# Patient Record
Sex: Female | Born: 1960 | Race: White | Hispanic: No | Marital: Married | Smoking: Never smoker
Health system: Southern US, Community
[De-identification: ages and names within clinical notes are randomized; demographics above are authoritative.]

## PROBLEM LIST (undated history)

## (undated) DIAGNOSIS — E785 Hyperlipidemia, unspecified: Secondary | ICD-10-CM

## (undated) DIAGNOSIS — M1990 Unspecified osteoarthritis, unspecified site: Secondary | ICD-10-CM

## (undated) DIAGNOSIS — Z1371 Encounter for nonprocreative screening for genetic disease carrier status: Secondary | ICD-10-CM

## (undated) DIAGNOSIS — K449 Diaphragmatic hernia without obstruction or gangrene: Secondary | ICD-10-CM

## (undated) DIAGNOSIS — G43909 Migraine, unspecified, not intractable, without status migrainosus: Secondary | ICD-10-CM

## (undated) DIAGNOSIS — K219 Gastro-esophageal reflux disease without esophagitis: Secondary | ICD-10-CM

## (undated) DIAGNOSIS — M779 Enthesopathy, unspecified: Secondary | ICD-10-CM

## (undated) DIAGNOSIS — Z803 Family history of malignant neoplasm of breast: Secondary | ICD-10-CM

## (undated) DIAGNOSIS — C801 Malignant (primary) neoplasm, unspecified: Secondary | ICD-10-CM

## (undated) DIAGNOSIS — D219 Benign neoplasm of connective and other soft tissue, unspecified: Secondary | ICD-10-CM

## (undated) DIAGNOSIS — Z8041 Family history of malignant neoplasm of ovary: Secondary | ICD-10-CM

## (undated) HISTORY — DX: Migraine, unspecified, not intractable, without status migrainosus: G43.909

## (undated) HISTORY — DX: Benign neoplasm of connective and other soft tissue, unspecified: D21.9

## (undated) HISTORY — PX: BREAST CYST ASPIRATION: SHX578

## (undated) HISTORY — DX: Diaphragmatic hernia without obstruction or gangrene: K44.9

## (undated) HISTORY — PX: BREAST BIOPSY: SHX20

## (undated) HISTORY — DX: Family history of malignant neoplasm of ovary: Z80.41

## (undated) HISTORY — DX: Gastro-esophageal reflux disease without esophagitis: K21.9

## (undated) HISTORY — PX: BREAST EXCISIONAL BIOPSY: SUR124

## (undated) HISTORY — PX: TUBAL LIGATION: SHX77

## (undated) HISTORY — DX: Unspecified osteoarthritis, unspecified site: M19.90

## (undated) HISTORY — DX: Encounter for nonprocreative screening for genetic disease carrier status: Z13.71

## (undated) HISTORY — PX: MELANOMA EXCISION: SHX5266

## (undated) HISTORY — DX: Enthesopathy, unspecified: M77.9

## (undated) HISTORY — DX: Hyperlipidemia, unspecified: E78.5

## (undated) HISTORY — DX: Family history of malignant neoplasm of breast: Z80.3

## (undated) SURGERY — Surgical Case
Anesthesia: *Unknown

---

## 1987-09-02 HISTORY — PX: TUBAL LIGATION: SHX77

## 1995-09-02 HISTORY — PX: BILATERAL CARPAL TUNNEL RELEASE: SHX6508

## 2004-12-03 ENCOUNTER — Ambulatory Visit: Payer: Self-pay

## 2004-12-18 ENCOUNTER — Ambulatory Visit: Payer: Self-pay

## 2006-01-14 ENCOUNTER — Ambulatory Visit: Payer: Self-pay

## 2007-02-04 ENCOUNTER — Ambulatory Visit: Payer: Self-pay

## 2008-03-08 ENCOUNTER — Ambulatory Visit: Payer: Self-pay

## 2008-03-21 ENCOUNTER — Ambulatory Visit: Payer: Self-pay

## 2009-03-22 ENCOUNTER — Ambulatory Visit: Payer: Self-pay

## 2010-09-01 HISTORY — PX: MELANOMA EXCISION: SHX5266

## 2010-09-23 ENCOUNTER — Ambulatory Visit: Payer: Self-pay | Admitting: General Surgery

## 2011-01-10 ENCOUNTER — Ambulatory Visit: Payer: Self-pay | Admitting: Dermatology

## 2011-01-28 ENCOUNTER — Ambulatory Visit: Payer: Self-pay | Admitting: Surgery

## 2011-02-05 ENCOUNTER — Ambulatory Visit: Payer: Self-pay | Admitting: General Surgery

## 2011-03-28 ENCOUNTER — Ambulatory Visit: Payer: Self-pay

## 2012-04-15 ENCOUNTER — Ambulatory Visit: Payer: Self-pay

## 2013-04-19 ENCOUNTER — Ambulatory Visit: Payer: Self-pay

## 2013-09-01 DIAGNOSIS — D219 Benign neoplasm of connective and other soft tissue, unspecified: Secondary | ICD-10-CM

## 2013-09-01 HISTORY — DX: Benign neoplasm of connective and other soft tissue, unspecified: D21.9

## 2014-06-20 ENCOUNTER — Ambulatory Visit: Payer: Self-pay

## 2015-04-20 ENCOUNTER — Other Ambulatory Visit: Payer: Self-pay | Admitting: Unknown Physician Specialty

## 2015-04-20 DIAGNOSIS — Z1239 Encounter for other screening for malignant neoplasm of breast: Secondary | ICD-10-CM

## 2015-06-25 ENCOUNTER — Ambulatory Visit
Admission: RE | Admit: 2015-06-25 | Discharge: 2015-06-25 | Disposition: A | Discharge status: Home / Self Care | Payer: BC Managed Care – PPO | Source: Ambulatory Visit | Attending: Unknown Physician Specialty | Admitting: Unknown Physician Specialty

## 2015-06-25 DIAGNOSIS — Z1231 Encounter for screening mammogram for malignant neoplasm of breast: Secondary | ICD-10-CM | POA: Insufficient documentation

## 2015-06-25 DIAGNOSIS — Z1239 Encounter for other screening for malignant neoplasm of breast: Secondary | ICD-10-CM

## 2015-06-25 HISTORY — DX: Malignant (primary) neoplasm, unspecified: C80.1

## 2016-04-01 HISTORY — PX: COLONOSCOPY WITH ESOPHAGOGASTRODUODENOSCOPY (EGD): SHX5779

## 2016-07-15 ENCOUNTER — Other Ambulatory Visit: Payer: Self-pay | Admitting: Certified Nurse Midwife

## 2016-07-15 DIAGNOSIS — Z1231 Encounter for screening mammogram for malignant neoplasm of breast: Secondary | ICD-10-CM

## 2016-08-28 ENCOUNTER — Ambulatory Visit
Admission: RE | Admit: 2016-08-28 | Discharge: 2016-08-28 | Disposition: A | Discharge status: Home / Self Care | Payer: BC Managed Care – PPO | Source: Ambulatory Visit | Attending: Certified Nurse Midwife | Admitting: Certified Nurse Midwife

## 2016-08-28 DIAGNOSIS — Z1231 Encounter for screening mammogram for malignant neoplasm of breast: Secondary | ICD-10-CM

## 2017-08-13 ENCOUNTER — Ambulatory Visit: Payer: Self-pay | Admitting: Certified Nurse Midwife

## 2017-08-28 ENCOUNTER — Encounter: Payer: Self-pay | Admitting: Certified Nurse Midwife

## 2017-08-28 ENCOUNTER — Ambulatory Visit (INDEPENDENT_AMBULATORY_CARE_PROVIDER_SITE_OTHER): Payer: BC Managed Care – PPO | Admitting: Certified Nurse Midwife

## 2017-08-28 ENCOUNTER — Other Ambulatory Visit: Payer: BC Managed Care – PPO

## 2017-08-28 VITALS — BP 110/70 | HR 74 | Ht 62.0 in | Wt 158.0 lb

## 2017-08-28 DIAGNOSIS — Z803 Family history of malignant neoplasm of breast: Secondary | ICD-10-CM

## 2017-08-28 DIAGNOSIS — K449 Diaphragmatic hernia without obstruction or gangrene: Secondary | ICD-10-CM | POA: Insufficient documentation

## 2017-08-28 DIAGNOSIS — Z124 Encounter for screening for malignant neoplasm of cervix: Secondary | ICD-10-CM

## 2017-08-28 DIAGNOSIS — Z8041 Family history of malignant neoplasm of ovary: Secondary | ICD-10-CM | POA: Diagnosis not present

## 2017-08-28 DIAGNOSIS — Z1231 Encounter for screening mammogram for malignant neoplasm of breast: Secondary | ICD-10-CM | POA: Diagnosis not present

## 2017-08-28 DIAGNOSIS — N841 Polyp of cervix uteri: Secondary | ICD-10-CM | POA: Diagnosis not present

## 2017-08-28 DIAGNOSIS — K219 Gastro-esophageal reflux disease without esophagitis: Secondary | ICD-10-CM | POA: Insufficient documentation

## 2017-08-28 DIAGNOSIS — Z01419 Encounter for gynecological examination (general) (routine) without abnormal findings: Secondary | ICD-10-CM | POA: Diagnosis not present

## 2017-08-28 DIAGNOSIS — Z1239 Encounter for other screening for malignant neoplasm of breast: Secondary | ICD-10-CM

## 2017-08-28 DIAGNOSIS — C439 Malignant melanoma of skin, unspecified: Secondary | ICD-10-CM | POA: Insufficient documentation

## 2017-08-28 NOTE — Progress Notes (Signed)
Gynecology Annual Exam  PCP: Patient, No Pcp Per  Chief Complaint:  Chief Complaint  Patient presents with  . Gynecologic Exam    History of Present Illness:Diamond Murray presents today for her annual exam. She is a 56 year old Caucasian/White female, G1 P1001, whose LMP was 12/01/2014. She is having no significant GYN problems. Has recently had some perianal burning and itching after a constipated stool. Once she had some blood on the toilet tissue after a hard stool. Has been using Cortisone ointment and Prep H with some relief. Wants to know if she has a hemorrhoid. Her menses are absent and she is postmenopausal. Her hot flashes have decreased in frequency and intensity, but occasionally still uses vitamin E.  She has had no spotting.   The patient's past medical history is notable for a history of arthritis, GERD, uterine fibroids, migraines, hiatal hernia, and seasonal allergies.. She has also had an excision of a melanoma from her right leg.  Since her last annual GYN exam dated 07/11/2016, she has had no significant changes in her health. She is sexually active. She does not have vaginal dryness.   Her most recent pap smear was obtained 07/11/2016 and was NIL.  Her most recent mammogram obtained on 08/28/2016 was normal. There is a positive history of breast cancer in her mother and sister. Genetic testing has been done. She tested negative for BRCA 1, negative for BRCA 2, and BART negative. There is a family history of ovarian cancer in her PGM (was reported as uterine cancer in the past). The patient does do monthly self breast exams.  She had a colonoscopy in August 2017 that showed non-cancerous polyps. Her next colonoscopy is due in 5 years She had a recent DEXA scan obtained in unsure date that showed osteopenia.  The patient does not smoke.  The patient does not drink alcohol.  The patient does not use illegal drugs.  The patient exercises regularly at home. The  patient does get adequate calcium in her diet and with her supplement. She had a recent cholesterol screen in 2016 by PCP at [facility]. that was normal.    The patient denies current symptoms of depression.    Review of Systems: Review of Systems  Constitutional: Negative for chills, fever and weight loss.  HENT: Negative for congestion, sinus pain and sore throat.   Eyes: Negative for blurred vision and pain.  Respiratory: Negative for hemoptysis, shortness of breath and wheezing.   Cardiovascular: Negative for chest pain, palpitations and leg swelling.  Gastrointestinal: Positive for blood in stool (once after hard stool). Negative for abdominal pain, diarrhea, heartburn, nausea and vomiting.       Positive for rectal itching and burning  Genitourinary: Negative for dysuria, frequency, hematuria and urgency.  Musculoskeletal: Positive for back pain and joint pain (legs and knees). Negative for myalgias.  Skin: Negative for itching and rash.  Neurological: Negative for dizziness, tingling and headaches.  Endo/Heme/Allergies: Positive for environmental allergies. Negative for polydipsia. Does not bruise/bleed easily.       Negative for hirsutism   Psychiatric/Behavioral: Negative for depression. The patient is not nervous/anxious and does not have insomnia.     Past Medical History:  Past Medical History:  Diagnosis Date  . Arthritis   . Cancer ([place])    Melanoma on leg  . Family history of breast cancer in first degree relative    mother and sister  . Family history of  ovarian cancer   . Fibroids 2015   intramural  . GERD (gastroesophageal reflux disease)   . Hiatal hernia     Past Surgical History:  Past Surgical History:  Procedure Laterality Date  . BILATERAL CARPAL TUNNEL RELEASE  1997  . BREAST BIOPSY Right    asp and 2 bx   . COLONOSCOPY WITH ESOPHAGOGASTRODUODENOSCOPY (EGD)  04/2016   one non cancerous polyp  . MELANOMA EXCISION  2012   right  leg  . TUBAL LIGATION      Family History:  Family History  Problem Relation Age of Onset  . Breast cancer Mother 72  . Diabetes Mother   . Cancer Mother        mouth cancer  . COPD Mother   . Breast cancer Sister 32  . Hypertension Sister   . Ovarian cancer Paternal Grandmother 69  . Hypertension Brother        donated kidney to father  . Colon cancer Maternal Grandmother 44  . AAA (abdominal aortic aneurysm) Maternal Grandmother 80       cause of death  . Lung cancer Maternal Grandfather 46  . Kidney failure Father        kidney transplant    Social History:  Social History   Socioeconomic History  . Marital status: Married    Spouse name: Not on file  . Number of children: 1  . Years of education: Not on file  . Highest education level: Not on file  Social Needs  . Financial resource strain: Not on file  . Food insecurity - worry: Not on file  . Food insecurity - inability: Not on file  . Transportation needs - medical: Not on file  . Transportation needs - non-medical: Not on file  Occupational History  . Occupation: Control and instrumentation engineer  Tobacco Use  . Smoking status: Never Smoker  . Smokeless tobacco: Never Used  Substance and Sexual Activity  . Alcohol use: No    Frequency: Never  . Drug use: No  . Sexual activity: Yes    Birth control/protection: Post-menopausal  Other Topics Concern  . Not on file  Social History Narrative  . Not on file    Allergies:  Allergies  Allergen Reactions  . Nitrofurantoin Nausea Only    & WEAKNESS  . Tomato Hives  . Sulfa Antibiotics Swelling, Rash and Hives   Medications:  Current Outpatient Medications:  .  calcium citrate-vitamin D (CITRACAL+D) 315-200 MG-UNIT tablet, Take by mouth., Disp: , Rfl:  .  Cetirizine HCl (ZYRTEC ALLERGY) 10 MG CAPS, Take by mouth., Disp: , Rfl:  .  Cyanocobalamin (VITAMIN B-12) 2000 MCG TBCR, Take by mouth., Disp: , Rfl:  .  omeprazole (PRILOSEC) 20 MG capsule, Take 20 mg by mouth  daily., Disp: , Rfl:  .  SUMAtriptan (IMITREX) 100 MG tablet, Take by mouth., Disp: , Rfl:  .  vitamin E 400 UNIT capsule, Take by mouth., Disp: , Rfl:    Physical Exam Vitals: BP 110/70   Pulse 74   Ht '5\' 2"'$  (1.575 m)   Wt 158 lb (71.7 kg)   BMI 28.90 kg/m   General: WF in NAD HEENT: normocephalic, anicteric Neck: no thyroid enlargement, no palpable nodules, no cervical lymphadenopathy  Pulmonary: No increased work of breathing, CTAB Cardiovascular: RRR, without murmur  Breast: Breast symmetrical, no tenderness, no palpable nodules or masses, no skin or nipple retraction present, no nipple discharge.  No axillary, infraclavicular or supraclavicular lymphadenopathy. Abdomen: Soft, non-tender, non-distended.  Umbilicus without lesions.  No hepatomegaly or masses palpable. No evidence of hernia. Genitourinary:  External: Normal external female genitalia.  Normal urethral meatus, normal Bartholin's and Skene's glands.    Vagina: decreased rugae, no evidence of prolapse.    Cervix: Endocervical polyp at cervical os on a stalk-removed and sent to pathology, silver nitrate applied for hemostasis; NT  Uterus: Anteverted, small, mobile, and non-tender  Adnexa: No adnexal masses, non-tender  Rectal: small swelling at 12 o'clock ? hemorrhoid  Lymphatic: no evidence of inguinal lymphadenopathy Extremities: no edema, erythema, or tenderness Neurologic: Grossly intact Psychiatric: mood appropriate, affect full     Assessment: 56 y.o. annual gyn exam Endocervical polyp Family history of breast and ovarian cancer  Plan:    1) Breast cancer screening - recommend monthly self breast exam and annual mammograms. Mammogram was ordered today. Recommended patient have the [place] update test. She agreed and blood work was drawn and sent to [facility]. Will have her return in 6 weeks for results. TC has not been done. Will see what lifetime risk is after MYRISK returns and offer risk reduction  strategies based upon their recommendations.  2) Colon cancer screening-colonoscopy UTD, next due in 2022  3) Cervical cancer screening - Pap was done. Endocervical polyp was removed and sent to pathology. Will call with results.  4) Routine healthcare maintenance including cholesterol and diabetes screening managed by PCP   Dalia Heading, CNM

## 2017-08-29 LAB — IGP,RFX APTIMA HPV ALL PTH: PAP Smear Comment: 0

## 2017-09-01 DIAGNOSIS — Z1371 Encounter for nonprocreative screening for genetic disease carrier status: Secondary | ICD-10-CM

## 2017-09-01 HISTORY — DX: Encounter for nonprocreative screening for genetic disease carrier status: Z13.71

## 2017-09-02 LAB — PATHOLOGY

## 2017-09-14 ENCOUNTER — Encounter: Payer: Self-pay | Admitting: Obstetrics and Gynecology

## 2017-09-21 ENCOUNTER — Ambulatory Visit
Admission: RE | Admit: 2017-09-21 | Discharge: 2017-09-21 | Disposition: A | Discharge status: Home / Self Care | Payer: BC Managed Care – PPO | Source: Ambulatory Visit | Attending: Certified Nurse Midwife | Admitting: Certified Nurse Midwife

## 2017-09-21 DIAGNOSIS — Z1231 Encounter for screening mammogram for malignant neoplasm of breast: Secondary | ICD-10-CM | POA: Diagnosis not present

## 2017-09-21 DIAGNOSIS — Z1239 Encounter for other screening for malignant neoplasm of breast: Secondary | ICD-10-CM

## 2017-09-21 DIAGNOSIS — R928 Other abnormal and inconclusive findings on diagnostic imaging of breast: Secondary | ICD-10-CM | POA: Diagnosis not present

## 2017-09-22 ENCOUNTER — Other Ambulatory Visit: Payer: Self-pay | Admitting: Certified Nurse Midwife

## 2017-09-22 DIAGNOSIS — N6310 Unspecified lump in the right breast, unspecified quadrant: Secondary | ICD-10-CM

## 2017-09-22 DIAGNOSIS — R928 Other abnormal and inconclusive findings on diagnostic imaging of breast: Secondary | ICD-10-CM

## 2017-10-01 ENCOUNTER — Ambulatory Visit
Admission: RE | Admit: 2017-10-01 | Discharge: 2017-10-01 | Disposition: A | Discharge status: Home / Self Care | Payer: BC Managed Care – PPO | Source: Ambulatory Visit | Attending: Certified Nurse Midwife | Admitting: Certified Nurse Midwife

## 2017-10-01 DIAGNOSIS — R928 Other abnormal and inconclusive findings on diagnostic imaging of breast: Secondary | ICD-10-CM

## 2017-10-01 DIAGNOSIS — Z803 Family history of malignant neoplasm of breast: Secondary | ICD-10-CM | POA: Insufficient documentation

## 2017-10-01 DIAGNOSIS — N6310 Unspecified lump in the right breast, unspecified quadrant: Secondary | ICD-10-CM

## 2017-10-13 ENCOUNTER — Ambulatory Visit: Payer: BC Managed Care – PPO | Admitting: Certified Nurse Midwife

## 2017-10-15 ENCOUNTER — Ambulatory Visit (INDEPENDENT_AMBULATORY_CARE_PROVIDER_SITE_OTHER): Payer: BC Managed Care – PPO | Admitting: Certified Nurse Midwife

## 2017-10-15 ENCOUNTER — Encounter: Payer: Self-pay | Admitting: Certified Nurse Midwife

## 2017-10-15 VITALS — BP 112/62 | HR 90 | Ht 62.0 in | Wt 159.0 lb

## 2017-10-15 DIAGNOSIS — Z803 Family history of malignant neoplasm of breast: Secondary | ICD-10-CM

## 2017-10-15 DIAGNOSIS — Z1379 Encounter for other screening for genetic and chromosomal anomalies: Secondary | ICD-10-CM | POA: Diagnosis not present

## 2017-10-15 DIAGNOSIS — Z8041 Family history of malignant neoplasm of ovary: Secondary | ICD-10-CM | POA: Diagnosis not present

## 2017-10-15 NOTE — Progress Notes (Signed)
  History of Present Illness:  Diamond Murray is a 57 y.o. who presents for her [place] results that was done On 08/28/2017. She has a family history of breast cancer in her mother and sister and ovarian cancer  In her PGM. Previous HBOC testing was negative.  The Myrisk update testing was also negative. Her lifetime risk of breast cancer is calculated at 16.6%  PMHx: She  has a past medical history of Arthritis, BRCA gene mutation negative (09/2017), Cancer [facility]), Family history of breast cancer in first degree relative, Family history of ovarian cancer, Fibroids (2015), GERD (gastroesophageal reflux disease), and Hiatal hernia. Also,  has a past surgical history that includes Breast biopsy (Right); Bilateral carpal tunnel release (1997); Colonoscopy with esophagogastroduodenoscopy (egd) (04/2016); Melanoma excision (2012); and Tubal ligation., family history includes AAA (abdominal aortic aneurysm) (age of onset: 58) in her maternal grandmother; Breast cancer (age of onset: 33) in her mother; Breast cancer (age of onset: 43) in her sister; COPD in her mother; Cancer in her mother; Colon cancer (age of onset: 44) in her maternal grandmother; Diabetes in her mother; Hypertension in her brother and sister; Kidney failure in her father; Lung cancer (age of onset: 28) in her maternal grandfather; Ovarian cancer (age of onset: 72) in her paternal grandmother.,  reports that  has never smoked. she has never used smokeless tobacco. She reports that she does not drink alcohol or use drugs.  She has a current medication list which includes the following prescription(s): calcium citrate-vitamin d, cetirizine hcl, vitamin b-12, omeprazole, sumatriptan, and vitamin e. Also, is allergic to nitrofurantoin; tomato; and sulfa antibiotics.  ROS  Physical Exam:  BP 112/62   Pulse 90   Ht _0  (1.575 m)   Wt 159 lb (72.1 kg)   BMI 29.08 kg/m  Body mass index is 29.08 kg/m. Constitutional: Well nourished, well  developed female in no acute distress.  Abdomen: diffusely non tender to palpation, non distended, and no masses, hernias Neuro: Grossly intact Psych:  Normal mood and affect.    Assessment: Negative MYRISK testing Lifetime risk of breast cancer of 16.6%  Plan: Recommend annual screening mammograms and anuual professional breat exams Encourage monthly SBE.  She was amenable to this plan and we will see her back for annual/PRN.  Dalia Heading, CNM [facility], [facility] 10/15/2017  4:05 PM

## 2018-09-12 NOTE — Progress Notes (Signed)
Gynecology Annual Exam  PCP: Patient, No Pcp Per  Chief Complaint:  Chief Complaint  Patient presents with  . Gynecologic Exam    No complaints    History of Present Illness:Diamond Murray presents today for her annual exam. She is a 58 year old postmenopausal Caucasian/White female, G1 P1001, whose LMP was 12/01/2014.  Denied vaginal bleeding, but had a discharge "with some color to it" recently. Has also had an area on the left side of her labia that has been intermittently itching/ irritated. Does have a history of eczema. Applied HC cream to the area with some relief. Has also used Neosporin with limited relief.  Her hot flashes have decreased in frequency and intensity, but occasionally still uses vitamin E.  She is sexually active. She does not have vaginal dryness or pain with intercourse at this time.    The patient's past medical history is notable for a history of arthritis, GERD, uterine fibroids, migraines, hiatal hernia, and seasonal allergies.. She has also had an excision of a melanoma from her right leg.  Since her last annual GYN exam dated 08/28/2017, she has had no other significant changes in her health. She is sexually active. She does not have vaginal dryness.   Her most recent pap smear was obtained 08/28/2017 and was NIL.  Her most recent mammogram obtained on 09/21/2017 was Birads 0 for possible right breast mass. Diagnostic mammogram and ultrasound were negative for any mass-and were Birads 1. There is a positive history of breast cancer in her mother and sister and a family history of ovarian cancer in her PGM (was reported as uterine cancer in the past).  Genetic testing has been done. [place] tested negative for MYRISK last year. Her lifetime risk of breast cancer is 16.6%.  The patient does do monthly self breast exams.  She had a colonoscopy in August 2017 that showed non-cancerous polyps. Her next colonoscopy is due in 5 years She had a recent DEXA scan  obtained in unsure date that showed osteopenia.  The patient does not smoke.  The patient does not drink alcohol.  The patient does not use illegal drugs.  The patient has not been exercising since getting a second job working at [employer]. The patient does get adequate calcium in her diet and with her supplement. She had a recent cholesterol screen in 2016 by PCP at [facility]. that was normal.      Review of Systems: Review of Systems  Constitutional: Negative for chills, fever and weight loss.  HENT: Negative for congestion, sinus pain and sore throat.   Eyes: Negative for blurred vision and pain.  Respiratory: Negative for hemoptysis, shortness of breath and wheezing.   Cardiovascular: Negative for chest pain, palpitations and leg swelling.  Gastrointestinal: Negative for abdominal pain, blood in stool (once after hard stool), diarrhea, heartburn, nausea and vomiting.       Positive for rectal itching and burning  Genitourinary: Negative for dysuria, frequency, hematuria and urgency.  Musculoskeletal: Positive for back pain and joint pain (legs and knees). Negative for myalgias.  Skin: Positive for itching and rash.  Neurological: Negative for dizziness, tingling and headaches.  Endo/Heme/Allergies: Positive for environmental allergies. Negative for polydipsia. Does not bruise/bleed easily.       Negative for hirsutism, positive for occasional hot flashes   Psychiatric/Behavioral: Negative for depression. The patient is nervous/anxious. The patient does not have insomnia.   Breast/chest:   Past Medical History:  Past Medical History:  Diagnosis Date  . Arthritis   . BRCA gene mutation negative 09/2017   MyRisk neg  . Cancer ([place])    Melanoma on leg  . Family history of breast cancer in first degree relative    mother and sister; IBIS=16%  . Family history of ovarian cancer   . Fibroids 2015   intramural  . GERD (gastroesophageal reflux disease)    . Hiatal hernia     Past Surgical History:  Past Surgical History:  Procedure Laterality Date  . BILATERAL CARPAL TUNNEL RELEASE  1997  . BREAST BIOPSY Right    asp and 2 bx   . COLONOSCOPY WITH ESOPHAGOGASTRODUODENOSCOPY (EGD)  04/2016   one non cancerous polyp  . MELANOMA EXCISION  2012   right leg  . TUBAL LIGATION      Family History:  Family History  Problem Relation Age of Onset  . Breast cancer Mother 1  . Diabetes Mother   . Cancer Mother        mouth cancer  . COPD Mother   . Breast cancer Sister 19  . Hypertension Sister   . Ovarian cancer Paternal Grandmother 30  . Hypertension Brother        donated kidney to father  . Colon cancer Maternal Grandmother 51  . AAA (abdominal aortic aneurysm) Maternal Grandmother 80       cause of death  . Lung cancer Maternal Grandfather 72  . Kidney failure Father        kidney transplant    Social History:  Social History   Socioeconomic History  . Marital status: Married    Spouse name: Not on file  . Number of children: 1  . Years of education: Not on file  . Highest education level: Not on file  Occupational History  . Occupation: Control and instrumentation engineer  Social Needs  . Financial resource strain: Not on file  . Food insecurity:    Worry: Not on file    Inability: Not on file  . Transportation needs:    Medical: Not on file    Non-medical: Not on file  Tobacco Use  . Smoking status: Never Smoker  . Smokeless tobacco: Never Used  Substance and Sexual Activity  . Alcohol use: No    Frequency: Never  . Drug use: No  . Sexual activity: Yes    Birth control/protection: Post-menopausal  Lifestyle  . Physical activity:    Days per week: 2 days    Minutes per session: 30 min  . Stress: To some extent  Relationships  . Social connections:    Talks on phone: More than three times a week    Gets together: Once a week    Attends religious service: More than 4 times per year    Active member of club or  organization: Yes    Attends meetings of clubs or organizations: More than 4 times per year    Relationship status: Married  . Intimate partner violence:    Fear of current or ex partner: No    Emotionally abused: No    Physically abused: No    Forced sexual activity: No  Other Topics Concern  . Not on file  Social History Narrative  . Not on file    Allergies:  Allergies  Allergen Reactions  . Nitrofurantoin Nausea Only    & WEAKNESS  . Tomato Hives  . Sulfa Antibiotics Swelling, Rash and Hives   Medications:  Current Outpatient Medications:  .  calcium citrate-vitamin D (CITRACAL+D) 315-200 MG-UNIT tablet, Take by mouth., Disp: , Rfl:  .  Cetirizine HCl (ZYRTEC ALLERGY) 10 MG CAPS, Take by mouth., Disp: , Rfl:  .  Cyanocobalamin (VITAMIN B-12) 2000 MCG TBCR, Take by mouth., Disp: , Rfl:  .  SUMAtriptan (IMITREX) 100 MG tablet, Take by mouth., Disp: , Rfl:  .  vitamin E 400 UNIT capsule, Take by mouth., Disp: , Rfl:  ..  omeprazole (PRILOSEC) 20 MG capsule, Take 20 mg by mouth daily., Disp: , Rfl:    Physical Exam Vitals: BP (!) 104/58 (BP Location: Right Arm, Patient Position: Sitting, Cuff Size: Normal)   Pulse 85   Ht _0  (1.575 m)   Wt 161 lb (73 kg)   SpO2 98%   BMI 29.45 kg/m   General: WF in NAD HEENT: normocephalic, anicteric Neck: no thyroid enlargement, no palpable nodules, no cervical lymphadenopathy  Pulmonary: No increased work of breathing, CTAB Cardiovascular: RRR, without murmur  Breast: Breast symmetrical, no tenderness, no palpable nodules or masses, no skin or nipple retraction present, no nipple discharge.  No axillary, infraclavicular or supraclavicular lymphadenopathy. Abdomen: Soft, non-tender, non-distended.  Umbilicus without lesions.  No hepatomegaly or masses palpable. No evidence of hernia. Genitourinary:  External: 2cm x 1 cm patch of inflammation on left buttock near panty line.  Normal urethral meatus, normal Bartholin's and Skene's  glands.    Vagina: decreased rugae, no evidence of prolapse, gray vaginal discharge   Cervix: no lesions, pink  Uterus: Anteverted, small, mobile, and non-tender  Adnexa: No adnexal masses, non-tender  Rectal: small inflamed hemorrhoid at 12 o'clcok  Lymphatic: no evidence of inguinal lymphadenopathy Extremities: no edema, erythema, or tenderness Neurologic: Grossly intact Psychiatric: mood appropriate, affect full  Results for orders placed or performed in visit on 09/13/18 (from the past 24 hour(s))  POCT Wet Prep [facility] [place])     Status: Abnormal   Collection Time: 09/13/18  5:38 PM  Result Value Ref Range   Source Wet Prep POC vagina    WBC, Wet Prep HPF POC many    Bacteria Wet Prep HPF POC Many (A) Few   BACTERIA WET PREP MORPHOLOGY POC     Clue Cells Wet Prep HPF POC None None   Clue Cells Wet Prep Whiff POC     Yeast Wet Prep HPF POC None None   KOH Wet Prep POC     Trichomonas Wet Prep HPF POC Absent Absent     Assessment: 58 y.o. 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 Mammogram was ordered today.  2) Colon cancer screening-colonoscopy UTD, next due in 2022  3) Cervical cancer screening - Pap was done.   4) Routine healthcare maintenance including cholesterol and diabetes screening managed by PCP   5) Clindamycin vaginal cream qhs x 7 days. Kenalog 0.1% to external rash BID prn.   6) RTO 1 year and prn persistent symptoms  Dalia Heading, CNM

## 2018-09-13 ENCOUNTER — Encounter: Payer: Self-pay | Admitting: Certified Nurse Midwife

## 2018-09-13 ENCOUNTER — Other Ambulatory Visit: Payer: Self-pay

## 2018-09-13 ENCOUNTER — Other Ambulatory Visit (HOSPITAL_COMMUNITY)
Admission: RE | Admit: 2018-09-13 | Discharge: 2018-09-13 | Disposition: A | Discharge status: Home / Self Care | Payer: BC Managed Care – PPO | Source: Ambulatory Visit | Attending: Certified Nurse Midwife | Admitting: Certified Nurse Midwife

## 2018-09-13 ENCOUNTER — Ambulatory Visit (INDEPENDENT_AMBULATORY_CARE_PROVIDER_SITE_OTHER): Payer: BC Managed Care – PPO | Admitting: Certified Nurse Midwife

## 2018-09-13 VITALS — BP 104/58 | HR 85 | Ht 62.0 in | Wt 161.0 lb

## 2018-09-13 DIAGNOSIS — Z124 Encounter for screening for malignant neoplasm of cervix: Secondary | ICD-10-CM | POA: Insufficient documentation

## 2018-09-13 DIAGNOSIS — N760 Acute vaginitis: Secondary | ICD-10-CM | POA: Diagnosis not present

## 2018-09-13 DIAGNOSIS — N898 Other specified noninflammatory disorders of vagina: Secondary | ICD-10-CM | POA: Diagnosis not present

## 2018-09-13 DIAGNOSIS — Z01419 Encounter for gynecological examination (general) (routine) without abnormal findings: Secondary | ICD-10-CM | POA: Insufficient documentation

## 2018-09-13 DIAGNOSIS — Z1239 Encounter for other screening for malignant neoplasm of breast: Secondary | ICD-10-CM

## 2018-09-13 LAB — POCT WET PREP (WET MOUNT): Trichomonas Wet Prep HPF POC: ABSENT

## 2018-09-13 MED ORDER — TRIAMCINOLONE ACETONIDE 0.1 % EX OINT: 1.0000 "application " | TOPICAL_OINTMENT | Freq: Two times a day (BID) | CUTANEOUS | 0 refills | Status: DC

## 2018-09-13 MED ORDER — CLINDAMYCIN PHOSPHATE 2 % VA CREA: 1.0000 | TOPICAL_CREAM | Freq: Every day | VAGINAL | 0 refills | Status: AC

## 2018-09-14 ENCOUNTER — Telehealth: Payer: Self-pay

## 2018-09-14 NOTE — Telephone Encounter (Signed)
Pt was seen yesterday; has betamethasone cream 0.05%; wants to know if it would be okay to use this instead of the rx you gave her yesterday.  [phone]

## 2018-09-14 NOTE — Telephone Encounter (Signed)
Called patient and left message that she can use that once or twice a day externally.

## 2018-09-15 LAB — CYTOLOGY - PAP: Diagnosis: NEGATIVE

## 2018-10-04 ENCOUNTER — Ambulatory Visit
Admission: RE | Admit: 2018-10-04 | Discharge: 2018-10-04 | Disposition: A | Discharge status: Home / Self Care | Payer: BC Managed Care – PPO | Source: Ambulatory Visit | Attending: Certified Nurse Midwife | Admitting: Certified Nurse Midwife

## 2018-10-04 DIAGNOSIS — Z1239 Encounter for other screening for malignant neoplasm of breast: Secondary | ICD-10-CM | POA: Diagnosis not present

## 2019-04-13 ENCOUNTER — Ambulatory Visit (INDEPENDENT_AMBULATORY_CARE_PROVIDER_SITE_OTHER): Payer: BC Managed Care – PPO | Admitting: Surgery

## 2019-04-13 ENCOUNTER — Other Ambulatory Visit: Payer: Self-pay

## 2019-04-13 ENCOUNTER — Encounter: Payer: Self-pay | Admitting: Surgery

## 2019-04-13 VITALS — BP 124/76 | HR 78 | Temp 97.9°F | Ht 62.0 in | Wt 171.0 lb

## 2019-04-13 DIAGNOSIS — Z9189 Other specified personal risk factors, not elsewhere classified: Secondary | ICD-10-CM | POA: Diagnosis not present

## 2019-04-13 NOTE — Patient Instructions (Addendum)
Continue annual mammograms. If next mammogram is abnormal may want to consider MRI of the breast.  We can see you if you have any further problems.  Follow-up with our office as needed.  Please call and ask to speak with a nurse if you develop questions or concerns.

## 2019-04-16 ENCOUNTER — Encounter: Payer: Self-pay | Admitting: Surgery

## 2019-04-16 NOTE — Progress Notes (Signed)
Patient ID: Diamond Murray, female   DOB: [birth year], 58 y.o.   MRN: [id number]  HPI Diamond Murray is a 58 y.o. female  Her most recent mammogram February 2020 did not show any evidence of malignancy.  Please note that I have personally reviewed the images.  She does have a positive history of breast cancer in her mother and sister and a family history of ovarian cancer in her PGM  Her lifetime risk of breast cancer is 16.6%.  The patient does do monthly self breast exams.  He was a little bit anxious and wanted to have a good breast exam for peace of mind. He denies any breast masses or discharge. No fevers no chills no weight loss.   HPI  Past Medical History:  Diagnosis Date  . Arthritis   . BRCA gene mutation negative 09/2017   MyRisk neg  . Cancer ([place])    Melanoma on leg  . Family history of breast cancer in first degree relative    mother and sister; IBIS=16%  . Family history of ovarian cancer   . Fibroids 2015   intramural  . GERD (gastroesophageal reflux disease)   . Hiatal hernia   . Migraines     Past Surgical History:  Procedure Laterality Date  . BILATERAL CARPAL TUNNEL RELEASE  1997  . BREAST BIOPSY Right    asp and 2 bx   . COLONOSCOPY WITH ESOPHAGOGASTRODUODENOSCOPY (EGD)  04/2016   one non cancerous polyp  . MELANOMA EXCISION  2012   right leg  . TUBAL LIGATION  1989    Family History  Problem Relation Age of Onset  . Breast cancer Mother 29  . Diabetes Mother   . Cancer Mother        mouth cancer  . COPD Mother   . Breast cancer Sister 3  . Hypertension Sister   . Ovarian cancer Paternal Grandmother 38  . Hypertension Brother        donated kidney to father  . Colon cancer Maternal Grandmother 78  . AAA (abdominal aortic aneurysm) Maternal Grandmother 80       cause of death  . Lung cancer Maternal Grandfather 44  . Kidney failure Father        kidney transplant    Social History Social History   Tobacco Use  . Smoking status: Never  Smoker  . Smokeless tobacco: Never Used  Substance Use Topics  . Alcohol use: No    Frequency: Never  . Drug use: No    Allergies  Allergen Reactions  . Nitrofurantoin Nausea Only    & WEAKNESS  . Tomato Hives  . Sulfa Antibiotics Swelling, Rash and Hives    Current Outpatient Medications  Medication Sig Dispense Refill  . calcium citrate-vitamin D (CITRACAL+D) 315-200 MG-UNIT tablet Take by mouth.    . Cetirizine HCl (ZYRTEC ALLERGY) 10 MG CAPS Take by mouth.    . Cyanocobalamin (VITAMIN B-12) 2000 MCG TBCR Take by mouth.    . famotidine (PEPCID) 10 MG tablet Take 10 mg by mouth 2 (two) times daily.    . SUMAtriptan (IMITREX) 100 MG tablet Take by mouth.     No current facility-administered medications for this visit.      Review of Systems Full ROS  was asked and was negative except for the information on the HPI  Physical Exam Blood pressure 124/76, pulse 78, temperature 97.9 F (36.6 C), height _0  (1.575 m), weight 171 lb (77.6 kg), SpO2  98 %. CONSTITUTIONAL: NAD. EYES: Pupils are equal, round, , Sclera are non-icteric. EARS, NOSE, MOUTH AND THROAT:  Hearing is intact to voice. LYMPH NODES:  Lymph nodes in the neck are normal. RESPIRATORY:  Lungs are clear. There is normal respiratory effort, with equal breath sounds bilaterally, and without pathologic use of accessory muscles. BREAST: There is no evidence of any breast masses on either breast.  No evidence of discharge.  Nipple and skin are intact.  There is no evidence of lymphadenopathy CARDIOVASCULAR: Heart is regular without murmurs, gallops, or rubs. GI: The abdomen is  soft, nontender, and nondistended. There are no palpable masses. There is no hepatosplenomegaly. There are normal bowel sounds in all quadrants. GU: Rectal deferred.   MUSCULOSKELETAL: Normal muscle strength and tone. No cyanosis or edema.   SKIN: Turgor is good and there are no pathologic skin lesions or ulcers. NEUROLOGIC: Motor and  sensation is grossly normal. Cranial nerves are grossly intact. PSYCH:  Oriented to person, place and time. Affect is normal.  Data Reviewed  I have personally reviewed the patient's imaging, laboratory findings and medical records.    Assessment/Plan 58 year old female with significant family history of breast cancer but no evidence of concerning lesions or mammogram or physical exam.  Recommend yearly mammogram next year.  If there are any  questionable findings on MRI will likely require an MRI to further identify any lesions. No need for further biopsies or interventions at this time.    Caroleen Hamman, MD FACS General Surgeon 04/16/2019, 1:42 PM

## 2019-04-20 IMAGING — MG MM DIGITAL SCREENING BILAT W/ TOMO W/ CAD
8 of 13 series · 8 of 29 positions shown · non-contrast
Comparison: Previous exam(s).

CLINICAL DATA: Screening.

EXAM:
2D DIGITAL SCREENING BILATERAL MAMMOGRAM WITH 3D TOMO WITH CAD

[L MLO (1 of 2)]
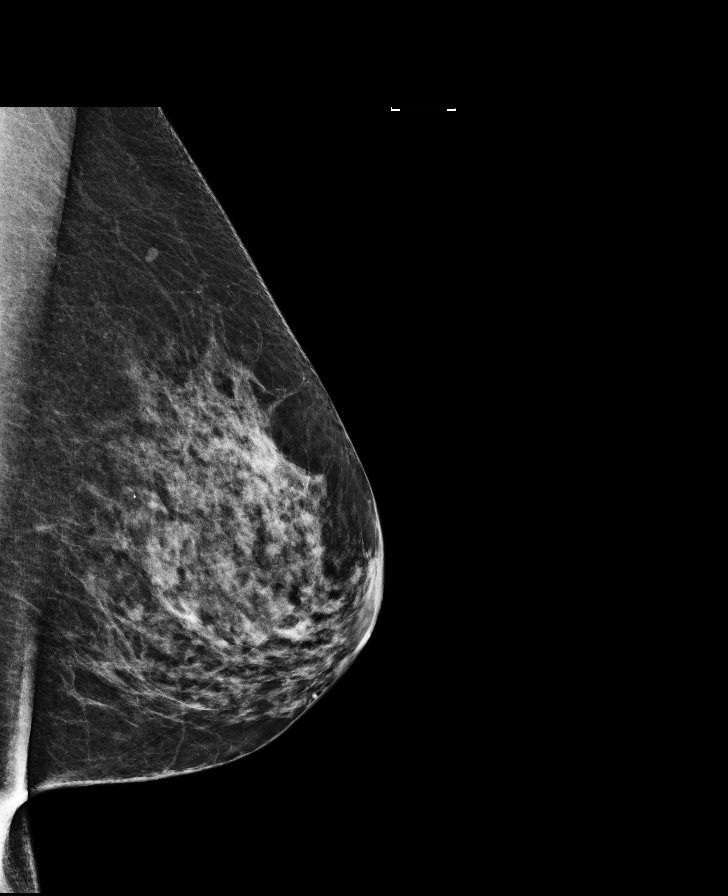

[L MLO synth-2D]
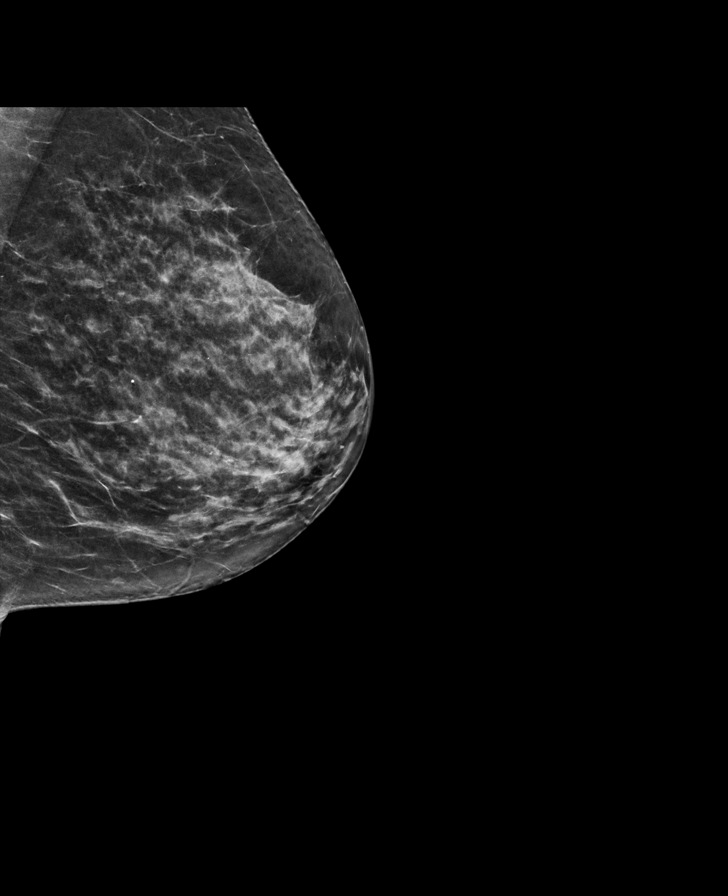

[L CC synth-2D]
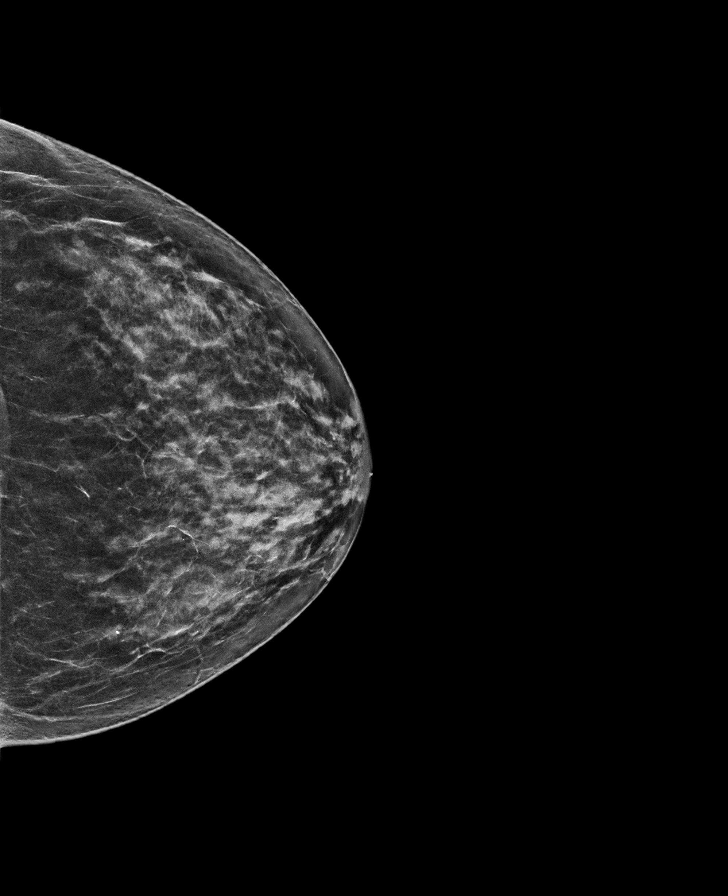

[L MLO (2 of 2)]
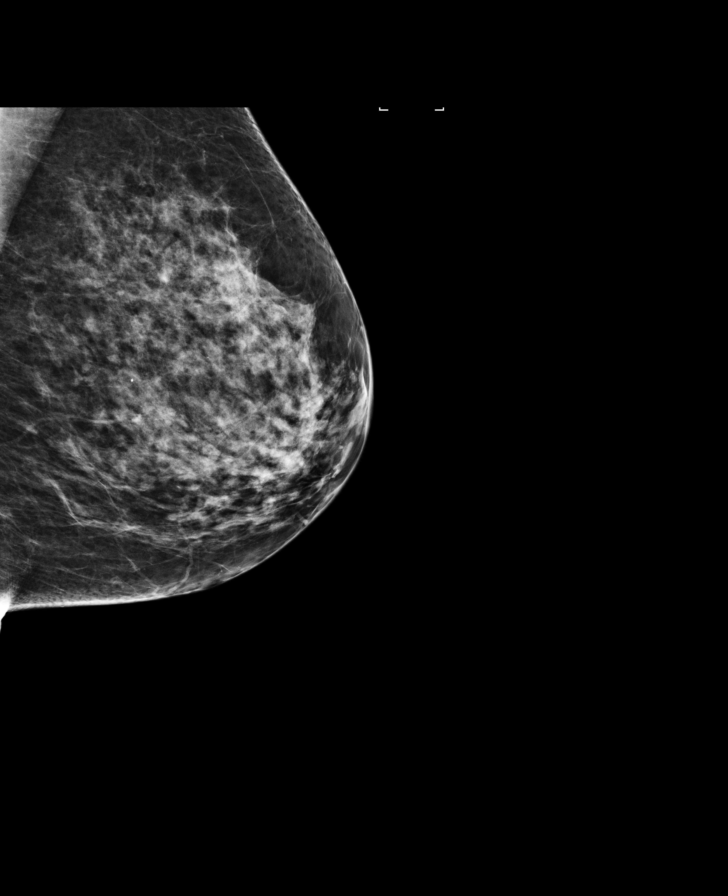

[R MLO]
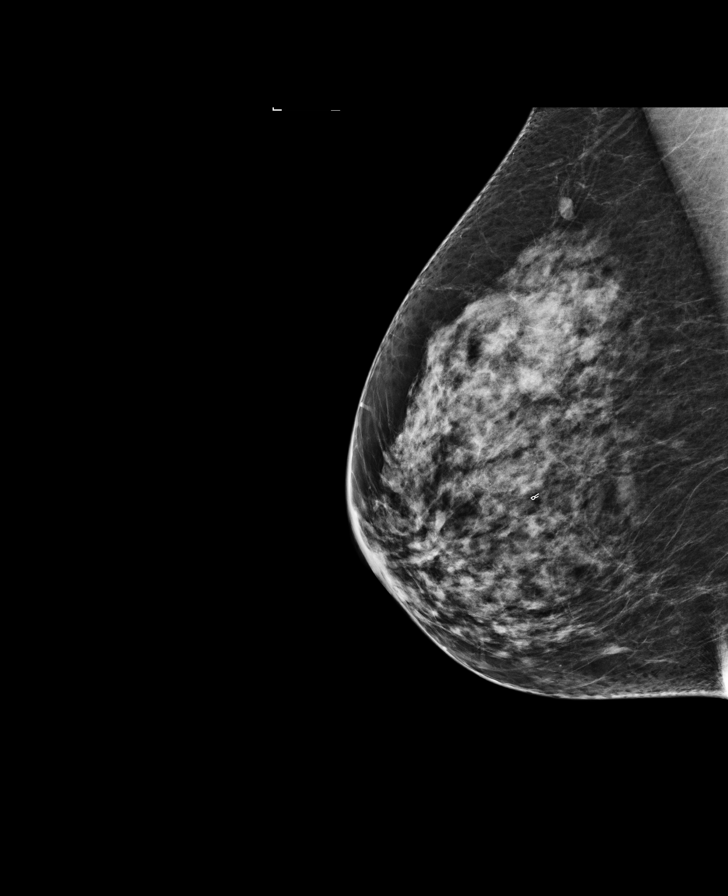

[L CC]
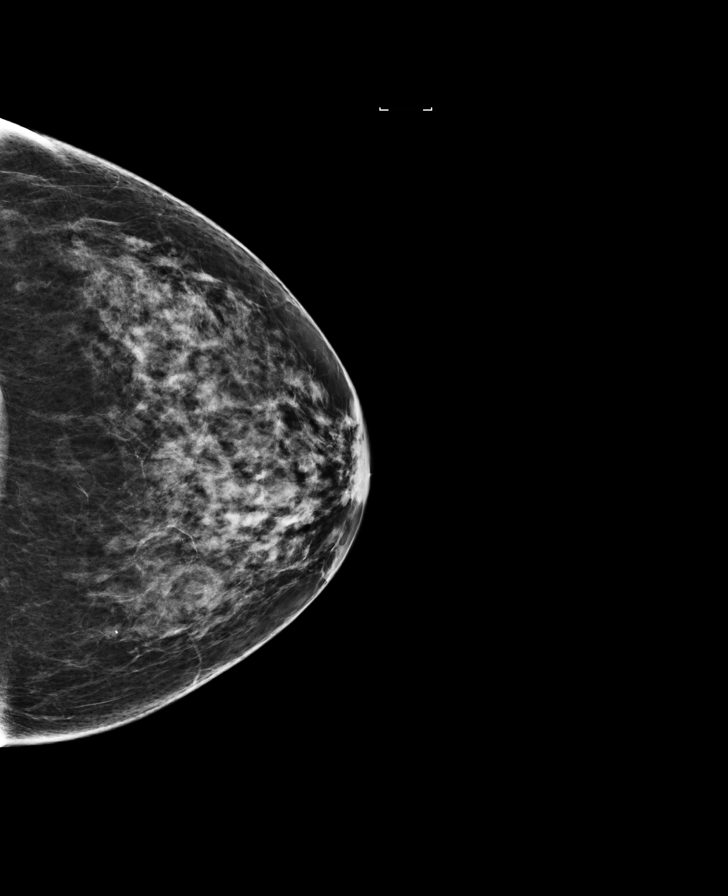

[R CC]
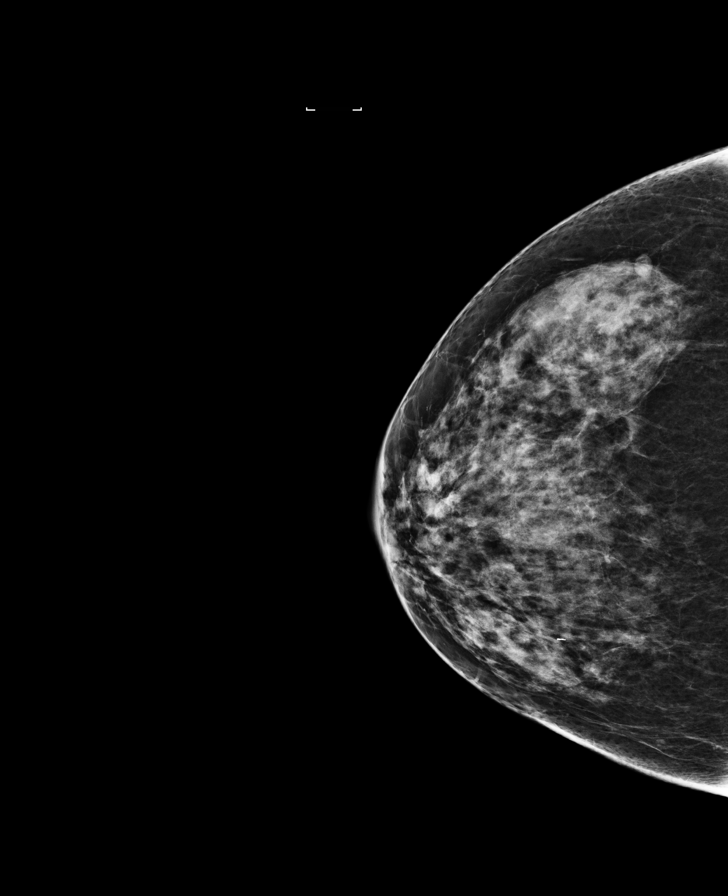

[R CC synth-2D]
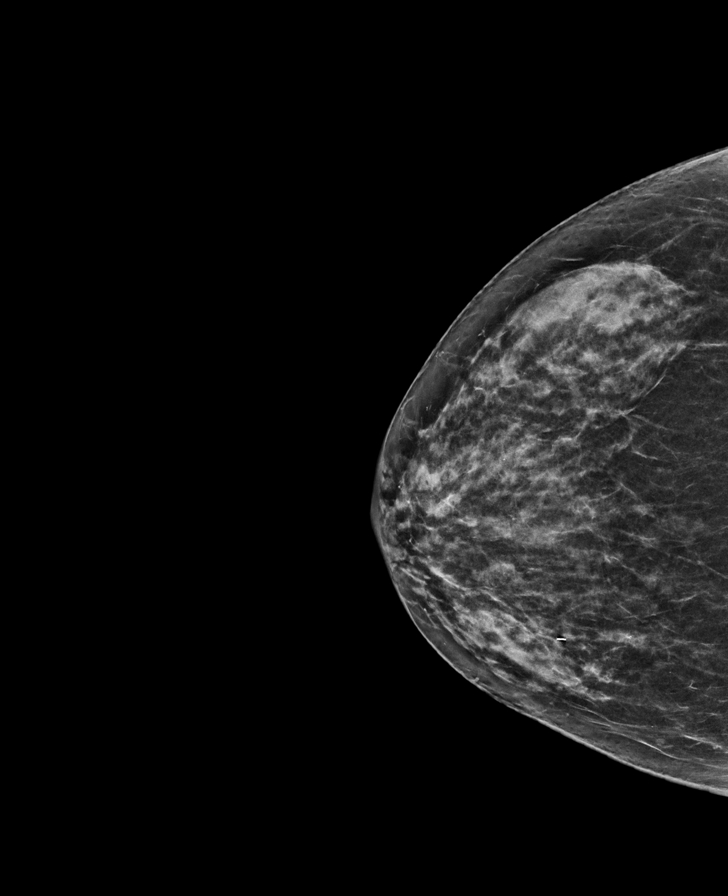

[8 of 29 positions shown; findings below may reference images not displayed]

ACR Breast Density Category c: The breast tissue is heterogeneously
dense, which may obscure small masses.
FINDINGS: In the right breast, a possible mass warrants further evaluation. In
the left breast, no findings suspicious for malignancy. Images were
processed with CAD.
IMPRESSION: Further evaluation is suggested for possible mass in the right
breast.

RECOMMENDATION:
Diagnostic mammogram and possibly ultrasound of the right breast.
(Code:[id number]-[id number])

The patient will be contacted regarding the findings, and additional
imaging will be scheduled.

BI-RADS CATEGORY  0: Incomplete. Need additional imaging evaluation
and/or prior mammograms for comparison.

## 2019-09-25 NOTE — Progress Notes (Signed)
Gynecology Annual Exam  PCP: Patient, No Pcp Per  Chief Complaint:  Chief Complaint  Patient presents with  . Gynecologic Exam    left side labial itching; lump - had what she thinks was a boil but it's gone; H/A, ezema.    History of Present Illness:Diamond Murray presents today for her annual exam. She is a 59 year old postmenopausal Caucasian/White female, G1 P1001, whose LMP was 12/01/2014. No vaginal bleeding. Has several concerns: 1) Felt a small right breast lump 2 days ago. Reports increased caffeine intake recently. 2)  Has also had an area on the left side of her labia that has been chronically  intermittently itching/ irritated. Around Christmas she developed what sounds like a foliculitis in this area. Had a small tender bump with a hair in the middle of it that eventually popped and drained. Now this area is scaly and continues to be itchy.  Does have a history of eczema. Applied HC cream or other steroid cream to the area with some relief. Has also used Neosporin with limited relief.  3) Complains of a frontal headache intermittently for the past 9 days. Tylenol does help relve pain. She denies sinus drainage or congestion, sore throat, rhinorrhea, fever. Her grandson who lives with her and her husband tested positive for Covid last Wednesday. She thinks her headache is from stress.    She is sexually active. She does not have vaginal dryness or pain with intercourse at this time.    The patient's past medical history is notable for a history of arthritis, GERD, uterine fibroids, migraines, hiatal hernia, and seasonal allergies.. She has also had an excision of a melanoma from her right leg.  Since her last annual GYN exam dated 09/13/2018, she has had a second melanoma removed from her posterior right thigh at [facility] on 01 July 2019. She was suppose to have a follow up in 3 months, but haas not done so due to Covid. She is sexually active. She does not have  vaginal dryness.   Her most recent pap smear was obtained 09/13/2018 and was NIL.  Her most recent mammogram obtained on 10/04/2018 was [facility] for possible right breast mass. There is a positive history of breast cancer in her mother and sister and a family history of ovarian cancer in her PGM (was reported as uterine cancer in the past).  Genetic testing has been done. Trine tested negative for MYRISK last year. Her lifetime risk of breast cancer is 16.6%.  The patient does do monthly self breast exams.  She had a colonoscopy in August 2017 that showed non-cancerous polyps. Her next colonoscopy is due next year She had a past DEXA scan obtained in unsure date that showed osteopenia.  The patient does not smoke.  The patient does not drink alcohol.  The patient does not use illegal drugs.  The patient has not been exercising. The patient does get adequate calcium in her diet and with her supplement. She had a recent cholesterol screen in 2016 by PCP at [facility] that was normal.      Review of Systems: Review of Systems  Constitutional: Positive for malaise/fatigue. Negative for chills, fever and weight loss.  HENT: Negative for congestion, sinus pain and sore throat.   Eyes: Positive for redness (and floaters). Negative for blurred vision and pain.  Respiratory: Negative for hemoptysis, shortness of breath and wheezing.   Cardiovascular: Negative for chest pain, palpitations  and leg swelling.  Gastrointestinal: Negative for abdominal pain, blood in stool (once after hard stool), diarrhea, heartburn, nausea and vomiting.       Positive for rectal itching and burning  Genitourinary: Negative for dysuria, frequency, hematuria and urgency.  Musculoskeletal: Positive for back pain and joint pain (legs and knees). Negative for myalgias.  Skin: Positive for itching and rash.  Neurological: Positive for headaches. Negative for dizziness and tingling.  Endo/Heme/Allergies:  Negative for environmental allergies and polydipsia. Does not bruise/bleed easily.       Negative for hirsutism, positive for occasional hot flashes   Psychiatric/Behavioral: Negative for depression. The patient is nervous/anxious. The patient does not have insomnia.   Breast/chest: positive for right breast lump  Past Medical History:  Past Medical History:  Diagnosis Date  . Arthritis    knees  . BRCA gene mutation negative 09/2017   MyRisk neg  . Cancer ([place])    Melanoma on leg  . Family history of breast cancer in first degree relative    mother and sister; IBIS=16%  . Family history of ovarian cancer   . Fibroids 2015   intramural  . GERD (gastroesophageal reflux disease)   . Hiatal hernia   . Migraines   . Osteoarthritis    back  . Tendonitis    knees    Past Surgical History:  Past Surgical History:  Procedure Laterality Date  . BILATERAL CARPAL TUNNEL RELEASE  1997  . BREAST BIOPSY Right    asp and 2 bx   . COLONOSCOPY WITH ESOPHAGOGASTRODUODENOSCOPY (EGD)  04/2016   one non cancerous polyp  . MELANOMA EXCISION  2012; 06/2019   right leg  . TUBAL LIGATION  1989    Family History:  Family History  Problem Relation Age of Onset  . Breast cancer Mother 24  . Diabetes Mother   . Cancer Mother        mouth cancer  . COPD Mother   . Breast cancer Sister 39  . Hypertension Sister   . Ovarian cancer Paternal Grandmother 26  . Hypertension Brother        donated kidney to father  . Colon cancer Maternal Grandmother 108  . AAA (abdominal aortic aneurysm) Maternal Grandmother 80       cause of death  . Lung cancer Maternal Grandfather 67  . Kidney failure Father        kidney transplant    Social History:  Social History   Socioeconomic History  . Marital status: Married    Spouse name: Not on file  . Number of children: 1  . Years of education: Not on file  . Highest education level: Not on file  Occupational History  . Occupation: Advertising copywriter  Tobacco Use  . Smoking status: Never Smoker  . Smokeless tobacco: Never Used  Substance and Sexual Activity  . Alcohol use: No  . Drug use: No  . Sexual activity: Yes    Birth control/protection: Post-menopausal  Other Topics Concern  . Not on file  Social History Narrative  . Not on file   Social Determinants of Health   Financial Resource Strain:   . Difficulty of Paying Living Expenses: Not on file  Food Insecurity:   . Worried About Charity fundraiser in the Last Year: Not on file  . Ran Out of Food in the Last Year: Not on file  Transportation Needs:   . Lack of Transportation (Medical): Not on file  . Lack  of Transportation (Non-Medical): Not on file  Physical Activity:   . Days of Exercise per Week: Not on file  . Minutes of Exercise per Session: Not on file  Stress:   . Feeling of Stress : Not on file  Social Connections:   . Frequency of Communication with Friends and Family: Not on file  . Frequency of Social Gatherings with Friends and Family: Not on file  . Attends Religious Services: Not on file  . Active Member of Clubs or Organizations: Not on file  . Attends Archivist Meetings: Not on file  . Marital Status: Not on file  Intimate Partner Violence:   . Fear of Current or Ex-Partner: Not on file  . Emotionally Abused: Not on file  . Physically Abused: Not on file  . Sexually Abused: Not on file    Allergies:  Allergies  Allergen Reactions  . Nitrofurantoin Nausea Only    & WEAKNESS  . Tomato Hives  . Sulfa Antibiotics Swelling, Rash and Hives   Medications:  Current Outpatient Medicatio:  Current Outpatient Medications:  .  calcium citrate-vitamin D (CITRACAL+D) 315-200 MG-UNIT tablet, Take by mouth., Disp: , Rfl:  .  Cetirizine HCl (ZYRTEC ALLERGY) 10 MG CAPS, Take by mouth., Disp: , Rfl:  .  Cyanocobalamin (VITAMIN B-12) 2000 MCG TBCR, Take by mouth., Disp: , Rfl:  .  famotidine (PEPCID) 10 MG tablet, Take 10 mg by mouth  2 (two) times daily., Disp: , Rfl:  .  SUMAtriptan (IMITREX) 100 MG tablet, Take by mouth., Disp: , Rfl:  .  Turmeric (QC TUMERIC COMPLEX PO), Take 950 mg by mouth daily., Disp: , Rfl:   Physical Exam Vitals: BP 124/86   Pulse 75   Temp (!) 96.8 F (36 C)   Ht '5\' 2"'$  (1.575 m)   Wt 179 lb (81.2 kg)   BMI 32.74 kg/m   General: WF in NAD HEENT: normocephalic, anicteric Neck: no thyroid enlargement, no palpable nodules, no cervical lymphadenopathy  Pulmonary: No increased work of breathing, CTAB Cardiovascular: RRR, without murmur fim Breast: Left breast: no tenderness, no palpable nodules or masses, no skin or nipple retraction present, no nipple discharge. Right breast: soft, 0.5cm mass at 7-8 o'clock just outside the areola. No skin or nipple changes, no nipple discharge.  No axillary, infraclavicular or supraclavicular lymphadenopathy. Abdomen: Soft, non-tender, non-distended.  Umbilicus without lesions.  No hepatomegaly or masses palpable. No evidence of hernia. Genitourinary:  External: small one centimeter scaly area on mid left labia majora.  No inflammation or mass. Normal urethral meatus, normal Bartholin's and Skene's glands.    Vagina: decreased rugae, no evidence of prolapse, gray vaginal discharge   Cervix: no lesions, pink  Uterus: Anteverted, small, mobile, and non-tender  Adnexa: No adnexal masses, non-tender  Rectal: deferred  Lymphatic: no evidence of inguinal lymphadenopathy Extremities: no edema, erythema, or tenderness Neurologic: Grossly intact Psychiatric: mood appropriate, affect full  Wet prep of vaginal discharge: negative for clue cells, hyphae or Trich. Increased WBCS with parabasilar and basilar cells   Assessment: 59 y.o. annual gyn exam Scaly pruritic rash on left labia majora Frontal headaches ? tension Right breast mass   Plan:    1) Right breast mass:: diagnostic bilateral mammogram and right breast ultrasound ordered  2) Colon cancer  screening-colonoscopy UTD, next due in 2022  3) Cervical cancer screening - Pap was done.   4) Routine healthcare maintenance including cholesterol and diabetes screening managed by PCP   5) Discussed getting a  biopsy of the left labial rash. Patient declines at this time. Will treat with clobetasol nightly x 2 weeks then 3x/week x 2 weeks. If rash returns after that recommend biopsy. Suggested that the dermatologist look at this area when she follows up for hr melanoma.   6) RTO 1 year and prn persistent symptoms  Dalia Heading, CNM

## 2019-09-26 ENCOUNTER — Other Ambulatory Visit: Payer: Self-pay

## 2019-09-26 ENCOUNTER — Encounter: Payer: Self-pay | Admitting: Certified Nurse Midwife

## 2019-09-26 ENCOUNTER — Other Ambulatory Visit (HOSPITAL_COMMUNITY)
Admission: RE | Admit: 2019-09-26 | Discharge: 2019-09-26 | Disposition: A | Discharge status: Home / Self Care | Payer: BC Managed Care – PPO | Source: Ambulatory Visit | Attending: Certified Nurse Midwife | Admitting: Certified Nurse Midwife

## 2019-09-26 ENCOUNTER — Ambulatory Visit (INDEPENDENT_AMBULATORY_CARE_PROVIDER_SITE_OTHER): Payer: BC Managed Care – PPO | Admitting: Certified Nurse Midwife

## 2019-09-26 VITALS — BP 124/86 | HR 75 | Temp 96.8°F | Ht 62.0 in | Wt 179.0 lb

## 2019-09-26 DIAGNOSIS — Z124 Encounter for screening for malignant neoplasm of cervix: Secondary | ICD-10-CM

## 2019-09-26 DIAGNOSIS — Z01419 Encounter for gynecological examination (general) (routine) without abnormal findings: Secondary | ICD-10-CM | POA: Diagnosis not present

## 2019-09-26 DIAGNOSIS — N6310 Unspecified lump in the right breast, unspecified quadrant: Secondary | ICD-10-CM

## 2019-09-26 MED ORDER — CLOBETASOL PROPIONATE 0.05 % EX CREA: TOPICAL_CREAM | CUTANEOUS | 1 refills | Status: DC

## 2019-09-27 ENCOUNTER — Encounter: Payer: Self-pay | Admitting: Certified Nurse Midwife

## 2019-09-27 ENCOUNTER — Telehealth: Payer: Self-pay | Admitting: Certified Nurse Midwife

## 2019-09-27 NOTE — Telephone Encounter (Signed)
Left message for pt to call back in regards to her mammo appt time and date. Please let me know if pt calls back.

## 2019-09-27 NOTE — Telephone Encounter (Signed)
Patient aware of her appt time and date at [facility] on 09/30/2019 @  1:40 pm

## 2019-09-27 NOTE — Telephone Encounter (Signed)
This encounter was created in error - please disregard.

## 2019-09-29 LAB — CYTOLOGY - PAP
Comment: NEGATIVE
Diagnosis: NEGATIVE
High risk HPV: NEGATIVE

## 2019-09-30 ENCOUNTER — Ambulatory Visit
Admission: RE | Admit: 2019-09-30 | Discharge: 2019-09-30 | Disposition: A | Discharge status: Home / Self Care | Payer: BC Managed Care – PPO | Source: Ambulatory Visit | Attending: Certified Nurse Midwife | Admitting: Certified Nurse Midwife

## 2019-09-30 DIAGNOSIS — N6310 Unspecified lump in the right breast, unspecified quadrant: Secondary | ICD-10-CM | POA: Insufficient documentation

## 2020-09-26 NOTE — Progress Notes (Signed)
PCP: Dalia Heading, CNM (Inactive)   Chief Complaint  Patient presents with   Gynecologic Exam    No concerns    HPI:      Diamond Murray is a 60 y.o. G1P1001 whose LMP was No LMP recorded. Patient is postmenopausal., presents today for her annual examination.  Her menses are absent due to menopause. No PMB. She has improving vasomotor sx.   Sex activity: single partner, contraception - post menopausal status. She does have vaginal dryness improved with lubricants.  Last Pap: 09/26/19  Results were: no abnormalities /neg HPV DNA. No hx of abn paps.  Last mammogram: 09/30/19  Results were: normal--routine follow-up in 12 months There is a FH of breast cancer in her mom and sister. There is no FH of ovarian cancer in her PGM. Pt is MyRisk neg; IBIS=16.6%. The patient does self-breast exams.  Colonoscopy: 2017 with non-cancerous polyp, FH colon cancer; Repeat due after 5 years. Dr. Vira Agar at [facility]  Tobacco use: The patient denies current or previous tobacco use. Alcohol use: none Exercise: min active  She does get adequate calcium and Vitamin D in her diet.  No recent fasting labs. Would like Hep C screening due to age/recommendation. Never done.   Past Medical History:  Diagnosis Date   Arthritis    knees   BRCA gene mutation negative 09/2017   MyRisk neg   Cancer ([place])    Melanoma on leg   Family history of breast cancer in first degree relative    mother and sister; IBIS=16%   Family history of ovarian cancer    Fibroids 2015   intramural   GERD (gastroesophageal reflux disease)    Hiatal hernia    Migraines    Osteoarthritis    back   Tendonitis    knees    Past Surgical History:  Procedure Laterality Date   BILATERAL CARPAL TUNNEL RELEASE  1997   BREAST BIOPSY Right    asp and 2 bx    BREAST CYST ASPIRATION     COLONOSCOPY WITH ESOPHAGOGASTRODUODENOSCOPY (EGD)  04/2016   one non cancerous polyp   MELANOMA EXCISION  2012; 06/2019    right leg   TUBAL LIGATION  1989    Family History  Problem Relation Age of Onset   Breast cancer Mother 83   Diabetes Mother    Cancer Mother        mouth cancer   COPD Mother    Breast cancer Sister 59   Hypertension Sister    Ovarian cancer Paternal Grandmother 33   Hypertension Brother        donated kidney to father   Colon cancer Maternal Grandmother 2   AAA (abdominal aortic aneurysm) Maternal Grandmother 80       cause of death   Lung cancer Maternal Grandfather 104   Kidney failure Father        kidney transplant    Social History   Socioeconomic History   Marital status: Married    Spouse name: Not on file   Number of children: 1   Years of education: Not on file   Highest education level: Not on file  Occupational History   Occupation: Control and instrumentation engineer  Tobacco Use   Smoking status: Never Smoker   Smokeless tobacco: Never Used  Scientific laboratory technician Use: Never used  Substance and Sexual Activity   Alcohol use: No   Drug use: No   Sexual activity: Yes  Birth control/protection: Post-menopausal  Other Topics Concern   Not on file  Social History Narrative   Not on file   Social Determinants of Health   Financial Resource Strain: Not on file  Food Insecurity: Not on file  Transportation Needs: Not on file  Physical Activity: Not on file  Stress: Not on file  Social Connections: Not on file  Intimate Partner Violence: Not on file     Current Outpatient Medications:    calcium citrate-vitamin D (CITRACAL+D) 315-200 MG-UNIT tablet, Take by mouth., Disp: , Rfl:    Cetirizine HCl 10 MG CAPS, Take by mouth., Disp: , Rfl:    Cyanocobalamin (VITAMIN B-12) 2000 MCG TBCR, Take by mouth., Disp: , Rfl:    famotidine (PEPCID) 10 MG tablet, Take 10 mg by mouth 2 (two) times daily., Disp: , Rfl:    SUMAtriptan (IMITREX) 100 MG tablet, Take by mouth. (Patient not taking: Reported on 09/27/2020), Disp: , Rfl:       ROS:  Review of Systems  Constitutional: Negative for fatigue, fever and unexpected weight change.  Respiratory: Negative for cough, shortness of breath and wheezing.   Cardiovascular: Negative for chest pain, palpitations and leg swelling.  Gastrointestinal: Negative for blood in stool, constipation, diarrhea, nausea and vomiting.  Endocrine: Negative for cold intolerance, heat intolerance and polyuria.  Genitourinary: Negative for dyspareunia, dysuria, flank pain, frequency, genital sores, hematuria, menstrual problem, pelvic pain, urgency, vaginal bleeding, vaginal discharge and vaginal pain.  Musculoskeletal: Positive for arthralgias. Negative for back pain, joint swelling and myalgias.  Skin: Negative for rash.  Neurological: Negative for dizziness, syncope, light-headedness, numbness and headaches.  Hematological: Negative for adenopathy.  Psychiatric/Behavioral: Negative for agitation, confusion, sleep disturbance and suicidal ideas. The patient is not nervous/anxious.    BREAST: No symptoms    Objective: BP 110/74    Ht $R'5\' 2"'IJ$  (1.575 m)    Wt 172 lb (78 kg)    BMI 31.46 kg/m    Physical Exam Constitutional:      Appearance: She is well-developed.  Genitourinary:     Vulva normal.     Right Labia: No rash, tenderness or lesions.    Left Labia: No tenderness, lesions or rash.    No vaginal discharge, erythema or tenderness.      Right Adnexa: not tender and no mass present.    Left Adnexa: not tender and no mass present.    No cervical friability or polyp.     Uterus is not enlarged or tender.  Breasts:     Right: No mass, nipple discharge, skin change or tenderness.     Left: No mass, nipple discharge, skin change or tenderness.    Neck:     Thyroid: No thyromegaly.  Cardiovascular:     Rate and Rhythm: Normal rate and regular rhythm.     Heart sounds: Normal heart sounds. No murmur heard.   Pulmonary:     Effort: Pulmonary effort is normal.      Breath sounds: Normal breath sounds.  Abdominal:     Palpations: Abdomen is soft.     Tenderness: There is no abdominal tenderness. There is no guarding or rebound.  Musculoskeletal:        General: Normal range of motion.     Cervical back: Normal range of motion.  Lymphadenopathy:     Cervical: No cervical adenopathy.  Neurological:     General: No focal deficit present.     Mental Status: She is alert and oriented to person,  place, and time.     Cranial Nerves: No cranial nerve deficit.  Skin:    General: Skin is warm and dry.  Psychiatric:        Mood and Affect: Mood normal.        Behavior: Behavior normal.        Thought Content: Thought content normal.        Judgment: Judgment normal.  Vitals reviewed.     Assessment/Plan:  Encounter for annual routine gynecological examination  Encounter for screening mammogram for malignant neoplasm of breast - Plan: MM 3D SCREEN BREAST BILATERAL; pt to sched mammo  Family history of breast cancer--Pt is MyRisk neg. Cont yearly mammos.   Screening for colon cancer--pt to sched with [facility]; will send ref prn.  Blood tests for routine general physical examination - Plan: Comprehensive metabolic panel, Lipid panel, Hepatitis C antibody  Screening cholesterol level - Plan: Lipid panel  BMI 31.0-31.9,adult - Plan: Comprehensive metabolic panel, Lipid panel  Screening for STD (sexually transmitted disease) - Plan: Hepatitis C antibody          GYN counsel breast self exam, mammography screening, menopause, adequate intake of calcium and vitamin D, diet and exercise    F/U  Return in about 1 year (around 09/27/2021).  Mykira Hofmeister B. Davette Nugent, PA-C 09/27/2020 8:56 AM

## 2020-09-27 ENCOUNTER — Encounter: Payer: Self-pay | Admitting: Obstetrics and Gynecology

## 2020-09-27 ENCOUNTER — Ambulatory Visit (INDEPENDENT_AMBULATORY_CARE_PROVIDER_SITE_OTHER): Payer: BC Managed Care – PPO | Admitting: Obstetrics and Gynecology

## 2020-09-27 ENCOUNTER — Other Ambulatory Visit: Payer: Self-pay

## 2020-09-27 VITALS — BP 110/74 | Ht 62.0 in | Wt 172.0 lb

## 2020-09-27 DIAGNOSIS — Z803 Family history of malignant neoplasm of breast: Secondary | ICD-10-CM

## 2020-09-27 DIAGNOSIS — Z1211 Encounter for screening for malignant neoplasm of colon: Secondary | ICD-10-CM

## 2020-09-27 DIAGNOSIS — Z1231 Encounter for screening mammogram for malignant neoplasm of breast: Secondary | ICD-10-CM | POA: Diagnosis not present

## 2020-09-27 DIAGNOSIS — Z01419 Encounter for gynecological examination (general) (routine) without abnormal findings: Secondary | ICD-10-CM

## 2020-09-27 DIAGNOSIS — Z13220 Encounter for screening for lipoid disorders: Secondary | ICD-10-CM

## 2020-09-27 DIAGNOSIS — Z6831 Body mass index (BMI) 31.0-31.9, adult: Secondary | ICD-10-CM

## 2020-09-27 DIAGNOSIS — Z0000 Encounter for general adult medical examination without abnormal findings: Secondary | ICD-10-CM

## 2020-09-27 DIAGNOSIS — Z113 Encounter for screening for infections with a predominantly sexual mode of transmission: Secondary | ICD-10-CM

## 2020-09-27 NOTE — Patient Instructions (Addendum)
I value your feedback and you entrusting us with your care. If you get a [facility] patient survey, I would appreciate you taking the time to let us know about your experience today. Thank you!  [facility]: [phone]      

## 2020-09-28 LAB — LIPID PANEL
Chol/HDL Ratio: 3 ratio (ref 0.0–4.4)
Cholesterol, Total: 234 mg/dL — ABNORMAL HIGH (ref 100–199)
HDL: 77 mg/dL (ref >39)
LDL Chol Calc (NIH): 139 mg/dL — ABNORMAL HIGH (ref 0–99)
Triglycerides: 104 mg/dL (ref 0–149)
VLDL Cholesterol Cal: 18 mg/dL (ref 5–40)

## 2020-09-28 LAB — COMPREHENSIVE METABOLIC PANEL
ALT: 21 IU/L (ref 0–32)
AST: 18 IU/L (ref 0–40)
Albumin/Globulin Ratio: 1.6 (ref 1.2–2.2)
Albumin: 4.6 g/dL (ref 3.8–4.9)
Alkaline Phosphatase: 63 IU/L (ref 44–121)
BUN/Creatinine Ratio: 26 (ref 12–28)
BUN: 21 mg/dL (ref 8–27)
Bilirubin Total: 0.3 mg/dL (ref 0.0–1.2)
CO2: 22 mmol/L (ref 20–29)
Calcium: 9.4 mg/dL (ref 8.7–10.3)
Chloride: 103 mmol/L (ref 96–106)
Creatinine, Ser: 0.81 mg/dL (ref 0.57–1.00)
GFR calc Af Amer: 91 mL/min/{1.73_m2} (ref >59)
GFR calc non Af Amer: 79 mL/min/{1.73_m2} (ref >59)
Globulin, Total: 2.9 g/dL (ref 1.5–4.5)
Glucose: 90 mg/dL (ref 65–99)
Potassium: 4.4 mmol/L (ref 3.5–5.2)
Sodium: 141 mmol/L (ref 134–144)
Total Protein: 7.5 g/dL (ref 6.0–8.5)

## 2020-09-28 LAB — HEPATITIS C ANTIBODY: Hep C Virus Ab: <0.1 s/co ratio (ref 0.0–0.9)

## 2020-10-23 ENCOUNTER — Ambulatory Visit
Admission: RE | Admit: 2020-10-23 | Discharge: 2020-10-23 | Disposition: A | Discharge status: Home / Self Care | Payer: BC Managed Care – PPO | Source: Ambulatory Visit | Attending: Obstetrics and Gynecology | Admitting: Obstetrics and Gynecology

## 2020-10-23 ENCOUNTER — Other Ambulatory Visit: Payer: Self-pay

## 2020-10-23 DIAGNOSIS — Z1231 Encounter for screening mammogram for malignant neoplasm of breast: Secondary | ICD-10-CM | POA: Diagnosis present

## 2020-11-08 ENCOUNTER — Encounter: Payer: Self-pay | Admitting: Obstetrics and Gynecology

## 2021-09-01 HISTORY — PX: COLONOSCOPY: SHX174

## 2021-09-30 NOTE — Progress Notes (Signed)
PCP: Chad Cordial, PA-C   Chief Complaint  Patient presents with   Annual Exam    HPI:      [name] is a 61 y.o. G1P1001 whose LMP was No LMP recorded. Patient is postmenopausal., presents today for her annual examination.  Her menses are absent due to menopause. No PMB. She has minimal vasomotor sx.   Sex activity: single partner, contraception - post menopausal status. She does have vaginal dryness improved with lubricants.  Last Pap: 09/26/19  Results were: no abnormalities /neg HPV DNA. No hx of abn paps.  Last mammogram: 10/23/20  Results were: normal--routine follow-up in 12 months There is a FH of breast cancer in her mom and sister. There is a FH of ovarian cancer in her PGM. Pt is MyRisk neg; IBIS=16.6%. The patient does self-breast exams.  Colonoscopy: 2017 with non-cancerous polyp, FH colon cancer; Repeat due after 5 years. Dr. Vira Agar at [facility]. Has appt 4/23 with Dr. Rayann Heman  Tobacco use: The patient denies current or previous tobacco use. Alcohol use: none No drug use Exercise: min active  She does get adequate calcium and some Vitamin D in her diet.  Had borderline LDL last yr, due for repeat labs this yr. Not fasting today. Seeing PCP for anxiety sx; under increased stress with fam stressors.   Past Medical History:  Diagnosis Date   Arthritis    knees   BRCA gene mutation negative 09/2017   MyRisk neg   Cancer ([place])    Melanoma on leg   Elevated lipids    Family history of breast cancer in first degree relative    mother and sister; IBIS=16%   Family history of ovarian cancer    Fibroids 2015   intramural   GERD (gastroesophageal reflux disease)    Hiatal hernia    Migraines    Osteoarthritis    back   Tendonitis    knees    Past Surgical History:  Procedure Laterality Date   BILATERAL CARPAL TUNNEL RELEASE  1997   BREAST BIOPSY Right    BREAST CYST ASPIRATION     BREAST EXCISIONAL BIOPSY Right    COLONOSCOPY WITH  ESOPHAGOGASTRODUODENOSCOPY (EGD)  04/2016   one non cancerous polyp   MELANOMA EXCISION  2012; 06/2019   right leg   TUBAL LIGATION  1989    Family History  Problem Relation Age of Onset   Breast cancer Mother 52   Diabetes Mother    Cancer Mother        mouth cancer   COPD Mother    Breast cancer Sister 97   Hypertension Sister    Ovarian cancer Paternal Grandmother 62   Hypertension Brother        donated kidney to father   Colon cancer Maternal Grandmother 13   AAA (abdominal aortic aneurysm) Maternal Grandmother 80       cause of death   Lung cancer Maternal Grandfather 68   Kidney failure Father        kidney transplant    Social History   Socioeconomic History   Marital status: Married    Spouse name: Not on file   Number of children: 1   Years of education: Not on file   Highest education level: Not on file  Occupational History   Occupation: Control and instrumentation engineer  Tobacco Use   Smoking status: Never   Smokeless tobacco: Never  Vaping Use   Vaping Use: Never used  Substance and Sexual  Activity   Alcohol use: No   Drug use: No   Sexual activity: Yes    Birth control/protection: Post-menopausal  Other Topics Concern   Not on file  Social History Narrative   Not on file   Social Determinants of Health   Financial Resource Strain: Not on file  Food Insecurity: Not on file  Transportation Needs: Not on file  Physical Activity: Not on file  Stress: Not on file  Social Connections: Not on file  Intimate Partner Violence: Not on file     Current Outpatient Medications:    calcium citrate-vitamin D (CITRACAL+D) 315-200 MG-UNIT tablet, Take by mouth., Disp: , Rfl:    Cetirizine HCl 10 MG CAPS, Take by mouth., Disp: , Rfl:    citalopram (CELEXA) 20 MG tablet, Take by mouth., Disp: , Rfl:    Cyanocobalamin (VITAMIN B-12) 2000 MCG TBCR, Take by mouth., Disp: , Rfl:    famotidine (PEPCID) 10 MG tablet, Take 10 mg by mouth 2 (two) times daily., Disp: , Rfl:     pantoprazole (PROTONIX) 40 MG tablet, , Disp: , Rfl:      ROS:  Review of Systems  Constitutional:  Negative for fatigue, fever and unexpected weight change.  Respiratory:  Negative for cough, shortness of breath and wheezing.   Cardiovascular:  Negative for chest pain, palpitations and leg swelling.  Gastrointestinal:  Negative for blood in stool, constipation, diarrhea, nausea and vomiting.  Endocrine: Negative for cold intolerance, heat intolerance and polyuria.  Genitourinary:  Negative for dyspareunia, dysuria, flank pain, frequency, genital sores, hematuria, menstrual problem, pelvic pain, urgency, vaginal bleeding, vaginal discharge and vaginal pain.  Musculoskeletal:  Negative for arthralgias, back pain, joint swelling and myalgias.  Skin:  Negative for rash.  Neurological:  Negative for dizziness, syncope, light-headedness, numbness and headaches.  Hematological:  Negative for adenopathy.  Psychiatric/Behavioral:  Positive for agitation. Negative for confusion, sleep disturbance and suicidal ideas. The patient is not nervous/anxious.   BREAST: No symptoms    Objective: BP 100/62 (Cuff Size: Normal)    Pulse 81    Ht _0  (1.575 m)    Wt 157 lb (71.2 kg)    BMI 28.72 kg/m    Physical Exam Constitutional:      Appearance: She is well-developed.  Genitourinary:     Vulva normal.     Right Labia: No rash, tenderness or lesions.    Left Labia: No tenderness, lesions or rash.    No vaginal discharge, erythema or tenderness.      Right Adnexa: not tender and no mass present.    Left Adnexa: not tender and no mass present.    No cervical friability or polyp.     Uterus is not enlarged or tender.  Breasts:    Right: No mass, nipple discharge, skin change or tenderness.     Left: No mass, nipple discharge, skin change or tenderness.  Neck:     Thyroid: No thyromegaly.  Cardiovascular:     Rate and Rhythm: Normal rate and regular rhythm.     Heart sounds: Normal heart  sounds. No murmur heard. Pulmonary:     Effort: Pulmonary effort is normal.     Breath sounds: Normal breath sounds.  Abdominal:     Palpations: Abdomen is soft.     Tenderness: There is no abdominal tenderness. There is no guarding or rebound.  Musculoskeletal:        General: Normal range of motion.     Cervical back: Normal  range of motion.  Lymphadenopathy:     Cervical: No cervical adenopathy.  Neurological:     General: No focal deficit present.     Mental Status: She is alert and oriented to person, place, and time.     Cranial Nerves: No cranial nerve deficit.  Skin:    General: Skin is warm and dry.  Psychiatric:        Mood and Affect: Mood normal.        Behavior: Behavior normal.        Thought Content: Thought content normal.        Judgment: Judgment normal.  Vitals reviewed.    Assessment/Plan: Encounter for annual routine gynecological examination  Encounter for screening mammogram for malignant neoplasm of breast - Plan: MM 3D SCREEN BREAST BILATERAL; pt to sheds mammo  Elevated lipids - Plan: Lipid panel  Screening cholesterol level - Plan: Lipid panel   GYN counsel breast self exam, mammography screening, menopause, adequate intake of calcium and vitamin D, diet and exercise    F/U  Return in about 1 year (around 10/01/2022).  Shante Archambeault B. Marcille Barman, PA-C 10/01/2021 10:38 AM

## 2021-10-01 ENCOUNTER — Ambulatory Visit (INDEPENDENT_AMBULATORY_CARE_PROVIDER_SITE_OTHER): Payer: BC Managed Care – PPO | Admitting: Obstetrics and Gynecology

## 2021-10-01 ENCOUNTER — Other Ambulatory Visit: Payer: Self-pay

## 2021-10-01 ENCOUNTER — Encounter: Payer: Self-pay | Admitting: Obstetrics and Gynecology

## 2021-10-01 VITALS — BP 100/62 | HR 81 | Ht 62.0 in | Wt 157.0 lb

## 2021-10-01 DIAGNOSIS — Z1231 Encounter for screening mammogram for malignant neoplasm of breast: Secondary | ICD-10-CM

## 2021-10-01 DIAGNOSIS — Z13220 Encounter for screening for lipoid disorders: Secondary | ICD-10-CM

## 2021-10-01 DIAGNOSIS — E785 Hyperlipidemia, unspecified: Secondary | ICD-10-CM | POA: Diagnosis not present

## 2021-10-01 DIAGNOSIS — Z01419 Encounter for gynecological examination (general) (routine) without abnormal findings: Secondary | ICD-10-CM | POA: Diagnosis not present

## 2021-10-01 NOTE — Patient Instructions (Signed)
I value your feedback and you entrusting us with your care. If you get a [facility] patient survey, I would appreciate you taking the time to let us know about your experience today. Thank you! ? ? ?

## 2021-10-03 ENCOUNTER — Other Ambulatory Visit: Payer: BC Managed Care – PPO

## 2021-10-03 ENCOUNTER — Other Ambulatory Visit: Payer: Self-pay

## 2021-10-03 DIAGNOSIS — E785 Hyperlipidemia, unspecified: Secondary | ICD-10-CM

## 2021-10-03 DIAGNOSIS — Z13220 Encounter for screening for lipoid disorders: Secondary | ICD-10-CM

## 2021-10-04 LAB — LIPID PANEL
Chol/HDL Ratio: 3.4 ratio (ref 0.0–4.4)
Cholesterol, Total: 212 mg/dL — ABNORMAL HIGH (ref 100–199)
HDL: 62 mg/dL (ref >39)
LDL Chol Calc (NIH): 135 mg/dL — ABNORMAL HIGH (ref 0–99)
Triglycerides: 87 mg/dL (ref 0–149)
VLDL Cholesterol Cal: 15 mg/dL (ref 5–40)

## 2021-11-20 ENCOUNTER — Other Ambulatory Visit: Payer: Self-pay

## 2021-11-20 ENCOUNTER — Ambulatory Visit
Admission: RE | Admit: 2021-11-20 | Discharge: 2021-11-20 | Disposition: A | Discharge status: Home / Self Care | Payer: BC Managed Care – PPO | Source: Ambulatory Visit | Attending: Obstetrics and Gynecology | Admitting: Obstetrics and Gynecology

## 2021-11-20 DIAGNOSIS — Z1231 Encounter for screening mammogram for malignant neoplasm of breast: Secondary | ICD-10-CM | POA: Diagnosis present

## 2022-11-12 NOTE — Progress Notes (Signed)
PCP: Rica Records, PA-C   Chief Complaint  Patient presents with   Gynecologic Exam    No concerns    HPI:      Diamond Murray is a 62 y.o. G1P1001 whose LMP was No LMP recorded. Patient is postmenopausal., presents today for her annual examination.  Her menses are absent due to menopause. No PMB. She has minimal vasomotor sx.   Sex activity: single partner, contraception - post menopausal status. She does have vaginal dryness improved with lubricants.  Last Pap: 09/26/19  Results were: no abnormalities /neg HPV DNA. No hx of abn paps.  Last mammogram: 11/20/21  Results were: normal--routine follow-up in 12 months There is a FH of breast cancer in her mom and sister. There is a FH of ovarian cancer in her PGM. Pt is MyRisk neg; IBIS=16.6%. The patient does occas self-breast exams.  Colonoscopy: 2017 and 4/23 with non-cancerous polyp at [facility]; FH colon cancer; Repeat due after 7 years per pt.   Tobacco use: The patient denies current or previous tobacco use. Alcohol use: none No drug use Exercise: min active  She does get adequate calcium and Vitamin D in her diet.  Had borderline LDL 2022/2023.  Due for recheck Seeing PCP for anxiety sx; under increased stress with fam stressors. Taking citalopram. Pt has been having intermittent chest pains for at least several months. Sx last a few minutes and can occur several times daily, then may not have sx again for a week or so. No SOB, back pain, arm pain. No DOE. No FH CAD. Pt is under increased stress and wonders if related to that since can happen at rest. No GI sx.    Past Medical History:  Diagnosis Date   Arthritis    knees   BRCA gene mutation negative 09/2017   MyRisk neg   Cancer ([facility])    Melanoma on leg   Elevated lipids    Family history of breast cancer in first degree relative    mother and sister; IBIS=16%   Family history of ovarian cancer    Fibroids 2015   intramural   GERD (gastroesophageal reflux  disease)    Hiatal hernia    Migraines    Osteoarthritis    back   Tendonitis    knees    Past Surgical History:  Procedure Laterality Date   BILATERAL CARPAL TUNNEL RELEASE  1997   BREAST BIOPSY Right    BREAST CYST ASPIRATION     BREAST EXCISIONAL BIOPSY Right    COLONOSCOPY  2023   COLONOSCOPY WITH ESOPHAGOGASTRODUODENOSCOPY (EGD)  04/2016   one non cancerous polyp   MELANOMA EXCISION  2012; 06/2019   right leg   TUBAL LIGATION  1989    Family History  Problem Relation Age of Onset   Breast cancer Mother 27   Diabetes Mother    Cancer Mother        mouth cancer   COPD Mother    Kidney failure Father        kidney transplant   Breast cancer Sister 72   Hypertension Sister    Hypertension Brother        donated kidney to father   Colon cancer Maternal Grandmother 81   AAA (abdominal aortic aneurysm) Maternal Grandmother 2       cause of death   Lung cancer Maternal Grandfather 30   Ovarian cancer Paternal Grandmother 85    Social History   Socioeconomic History  Marital status: Married    Spouse name: Not on file   Number of children: 1   Years of education: Not on file   Highest education level: Not on file  Occupational History   Occupation: Geologist, engineering  Tobacco Use   Smoking status: Never   Smokeless tobacco: Never  Vaping Use   Vaping Use: Never used  Substance and Sexual Activity   Alcohol use: No   Drug use: No   Sexual activity: Yes    Birth control/protection: Post-menopausal  Other Topics Concern   Not on file  Social History Narrative   Not on file   Social Determinants of Health   Financial Resource Strain: Not on file  Food Insecurity: Not on file  Transportation Needs: Not on file  Physical Activity: Insufficiently Active (08/28/2017)   Exercise Vital Sign    Days of Exercise per Week: 2 days    Minutes of Exercise per Session: 30 min  Stress: Stress Concern Present (08/28/2017)   [employer] of Occupational  Health - Occupational Stress Questionnaire    Feeling of Stress : To some extent  Social Connections: Socially Integrated (08/28/2017)   Social Connection and Isolation Panel [NHANES]    Frequency of Communication with Friends and Family: More than three times a week    Frequency of Social Gatherings with Friends and Family: Once a week    Attends Religious Services: More than 4 times per year    Active Member of [employer] or Organizations: Yes    Attends Engineer, structural: More than 4 times per year    Marital Status: Married  Catering manager Violence: Not At Risk (08/28/2017)   Humiliation, Afraid, Rape, and Kick questionnaire    Fear of Current or Ex-Partner: No    Emotionally Abused: No    Physically Abused: No    Sexually Abused: No     Current Outpatient Medications:    calcium citrate-vitamin D (CITRACAL+D) 315-200 MG-UNIT tablet, Take by mouth., Disp: , Rfl:    Cetirizine HCl 10 MG CAPS, Take by mouth., Disp: , Rfl:    citalopram (CELEXA) 20 MG tablet, Take by mouth., Disp: , Rfl:      ROS:  Review of Systems  Constitutional:  Negative for fatigue, fever and unexpected weight change.  Respiratory:  Negative for cough, shortness of breath and wheezing.   Cardiovascular:  Positive for chest pain. Negative for palpitations and leg swelling.  Gastrointestinal:  Negative for blood in stool, constipation, diarrhea, nausea and vomiting.  Endocrine: Negative for cold intolerance, heat intolerance and polyuria.  Genitourinary:  Negative for dyspareunia, dysuria, flank pain, frequency, genital sores, hematuria, menstrual problem, pelvic pain, urgency, vaginal bleeding, vaginal discharge and vaginal pain.  Musculoskeletal:  Positive for arthralgias. Negative for back pain, joint swelling and myalgias.  Skin:  Negative for rash.  Neurological:  Negative for dizziness, syncope, light-headedness, numbness and headaches.  Hematological:  Negative for adenopathy.   Psychiatric/Behavioral:  Positive for agitation and dysphoric mood. Negative for confusion, sleep disturbance and suicidal ideas. The patient is not nervous/anxious.    BREAST: No symptoms    Objective: BP 110/70   Ht 5\' 2"  (1.575 m)   Wt 165 lb (74.8 kg)   BMI 30.18 kg/m    Physical Exam Constitutional:      Appearance: She is well-developed.  Genitourinary:     Vulva normal.     Right Labia: No rash, tenderness or lesions.    Left Labia: No tenderness, lesions or  rash.    No vaginal discharge, erythema or tenderness.      Right Adnexa: not tender and no mass present.    Left Adnexa: not tender and no mass present.    No cervical friability or polyp.     Uterus is not enlarged or tender.  Breasts:    Right: No mass, nipple discharge, skin change or tenderness.     Left: No mass, nipple discharge, skin change or tenderness.  Neck:     Thyroid: No thyromegaly.  Cardiovascular:     Rate and Rhythm: Normal rate and regular rhythm.     Heart sounds: Normal heart sounds. No murmur heard. Pulmonary:     Effort: Pulmonary effort is normal.     Breath sounds: Normal breath sounds.  Abdominal:     Palpations: Abdomen is soft.     Tenderness: There is no abdominal tenderness. There is no guarding or rebound.  Musculoskeletal:        General: Normal range of motion.     Cervical back: Normal range of motion.  Lymphadenopathy:     Cervical: No cervical adenopathy.  Neurological:     General: No focal deficit present.     Mental Status: She is alert and oriented to person, place, and time.     Cranial Nerves: No cranial nerve deficit.  Skin:    General: Skin is warm and dry.  Psychiatric:        Mood and Affect: Mood normal.        Behavior: Behavior normal.        Thought Content: Thought content normal.        Judgment: Judgment normal.  Vitals reviewed.     Assessment/Plan: Encounter for annual routine gynecological examination  Encounter for screening  mammogram for malignant neoplasm of breast - Plan: MM 3D SCREENING MAMMOGRAM BILATERAL BREAST; pt to schedule mammo  Other chest pain - Plan: Ambulatory referral to Cardiology; refer to cardio to rule out path; if neg, most likely stress related.  Blood tests for routine general physical examination - Plan: Comprehensive metabolic panel, Lipid panel, Hemoglobin A1c  Screening cholesterol level - Plan: Lipid panel  Elevated lipids - Plan: Lipid panel  Screening for diabetes mellitus  BMI 30.0-30.9,adult - Plan: Hemoglobin A1c   GYN counsel breast self exam, mammography screening, menopause, adequate intake of calcium and vitamin D, diet and exercise    F/U  Return in about 1 year (around 11/13/2023).  Daeton Kluth B. Jaice Lague, PA-C 11/13/2022 9:29 AM

## 2022-11-13 ENCOUNTER — Encounter: Payer: Self-pay | Admitting: Obstetrics and Gynecology

## 2022-11-13 ENCOUNTER — Ambulatory Visit (INDEPENDENT_AMBULATORY_CARE_PROVIDER_SITE_OTHER): Payer: BC Managed Care – PPO | Admitting: Obstetrics and Gynecology

## 2022-11-13 VITALS — BP 110/70 | Ht 62.0 in | Wt 165.0 lb

## 2022-11-13 DIAGNOSIS — Z0000 Encounter for general adult medical examination without abnormal findings: Secondary | ICD-10-CM

## 2022-11-13 DIAGNOSIS — R0789 Other chest pain: Secondary | ICD-10-CM

## 2022-11-13 DIAGNOSIS — Z131 Encounter for screening for diabetes mellitus: Secondary | ICD-10-CM

## 2022-11-13 DIAGNOSIS — Z1211 Encounter for screening for malignant neoplasm of colon: Secondary | ICD-10-CM

## 2022-11-13 DIAGNOSIS — Z01419 Encounter for gynecological examination (general) (routine) without abnormal findings: Secondary | ICD-10-CM

## 2022-11-13 DIAGNOSIS — Z1231 Encounter for screening mammogram for malignant neoplasm of breast: Secondary | ICD-10-CM

## 2022-11-13 DIAGNOSIS — Z6830 Body mass index (BMI) 30.0-30.9, adult: Secondary | ICD-10-CM

## 2022-11-13 DIAGNOSIS — E785 Hyperlipidemia, unspecified: Secondary | ICD-10-CM

## 2022-11-13 DIAGNOSIS — Z13220 Encounter for screening for lipoid disorders: Secondary | ICD-10-CM

## 2022-11-13 NOTE — Patient Instructions (Signed)
I value your feedback and you entrusting us with your care. If you get a  patient survey, I would appreciate you taking the time to let us know about your experience today. Thank you!  [facility]: [phone]      

## 2022-11-14 LAB — COMPREHENSIVE METABOLIC PANEL
ALT: 14 IU/L (ref 0–32)
AST: 18 IU/L (ref 0–40)
Albumin/Globulin Ratio: 1.8 (ref 1.2–2.2)
Albumin: 4.6 g/dL (ref 3.9–4.9)
Alkaline Phosphatase: 62 IU/L (ref 44–121)
BUN/Creatinine Ratio: 22 (ref 12–28)
BUN: 19 mg/dL (ref 8–27)
Bilirubin Total: 0.5 mg/dL (ref 0.0–1.2)
CO2: 25 mmol/L (ref 20–29)
Calcium: 9.5 mg/dL (ref 8.7–10.3)
Chloride: 104 mmol/L (ref 96–106)
Creatinine, Ser: 0.86 mg/dL (ref 0.57–1.00)
Globulin, Total: 2.5 g/dL (ref 1.5–4.5)
Glucose: 86 mg/dL (ref 70–99)
Potassium: 4.8 mmol/L (ref 3.5–5.2)
Sodium: 142 mmol/L (ref 134–144)
Total Protein: 7.1 g/dL (ref 6.0–8.5)
eGFR: 76 mL/min/{1.73_m2} (ref >59)

## 2022-11-14 LAB — LIPID PANEL
Chol/HDL Ratio: 3.5 ratio (ref 0.0–4.4)
Cholesterol, Total: 243 mg/dL — ABNORMAL HIGH (ref 100–199)
HDL: 70 mg/dL (ref >39)
LDL Chol Calc (NIH): 152 mg/dL — ABNORMAL HIGH (ref 0–99)
Triglycerides: 122 mg/dL (ref 0–149)
VLDL Cholesterol Cal: 21 mg/dL (ref 5–40)

## 2022-11-14 LAB — HEMOGLOBIN A1C
Est. average glucose Bld gHb Est-mCnc: 111 mg/dL
Hgb A1c MFr Bld: 5.5 % (ref 4.8–5.6)

## 2022-12-04 ENCOUNTER — Ambulatory Visit
Admission: RE | Admit: 2022-12-04 | Discharge: 2022-12-04 | Disposition: A | Discharge status: Home / Self Care | Payer: BC Managed Care – PPO | Source: Ambulatory Visit | Attending: Obstetrics and Gynecology | Admitting: Obstetrics and Gynecology

## 2022-12-04 ENCOUNTER — Other Ambulatory Visit: Payer: Self-pay | Admitting: Internal Medicine

## 2022-12-04 DIAGNOSIS — Z1231 Encounter for screening mammogram for malignant neoplasm of breast: Secondary | ICD-10-CM | POA: Insufficient documentation

## 2022-12-04 DIAGNOSIS — R079 Chest pain, unspecified: Secondary | ICD-10-CM

## 2022-12-17 ENCOUNTER — Ambulatory Visit
Admission: RE | Admit: 2022-12-17 | Discharge: 2022-12-17 | Disposition: A | Discharge status: Home / Self Care | Payer: BC Managed Care – PPO | Source: Ambulatory Visit | Attending: Internal Medicine | Admitting: Internal Medicine

## 2022-12-17 DIAGNOSIS — R079 Chest pain, unspecified: Secondary | ICD-10-CM | POA: Insufficient documentation

## 2023-10-03 HISTORY — PX: SQUAMOUS CELL CARCINOMA EXCISION: SHX2433

## 2023-12-02 NOTE — Progress Notes (Unsigned)
 PCP: Rica Records, PA-C   No chief complaint on file.   HPI:      [name] is a 63 y.o. G1P1001 whose LMP was No LMP recorded. Patient is postmenopausal., presents today for her annual examination.  Her menses are absent due to menopause. No PMB. She has minimal vasomotor sx.   Sex activity: single partner, contraception - post menopausal status. She does have vaginal dryness improved with lubricants.  Last Pap: 09/26/19  Results were: no abnormalities /neg HPV DNA. No hx of abn paps.  Last mammogram: 12/04/22  Results were: normal--routine follow-up in 12 months There is a FH of breast cancer in her mom and sister. There is a FH of ovarian cancer in her PGM. Pt is MyRisk neg; IBIS=16.6%. The patient does occas self-breast exams.  Colonoscopy: 2017 and 4/23 with non-cancerous polyp at [facility]; FH colon cancer; Repeat due after 7 years per pt.   Tobacco use: The patient denies current or previous tobacco use. Alcohol use: none No drug use Exercise: min active  She does get adequate calcium and Vitamin D in her diet.  Had borderline LDL 2022/2023.  Due for recheck Seeing PCP for anxiety sx; under increased stress with fam stressors. Taking citalopram. Pt has been having intermittent chest pains for at least several months. Sx last a few minutes and can occur several times daily, then may not have sx again for a week or so. No SOB, back pain, arm pain. No DOE. No FH CAD. Pt is under increased stress and wonders if related to that since can happen at rest. No GI sx.    Past Medical History:  Diagnosis Date   Arthritis    knees   BRCA gene mutation negative 09/2017   MyRisk neg   Cancer (HCC)    Melanoma on leg   Elevated lipids    Family history of breast cancer in first degree relative    mother and sister; IBIS=16%   Family history of ovarian cancer    Fibroids 2015   intramural   GERD (gastroesophageal reflux disease)    Hiatal hernia    Migraines     Osteoarthritis    back   Tendonitis    knees    Past Surgical History:  Procedure Laterality Date   BILATERAL CARPAL TUNNEL RELEASE  1997   BREAST BIOPSY Right    BREAST CYST ASPIRATION     BREAST EXCISIONAL BIOPSY Right    COLONOSCOPY  2023   COLONOSCOPY WITH ESOPHAGOGASTRODUODENOSCOPY (EGD)  04/2016   one non cancerous polyp   MELANOMA EXCISION  2012; 06/2019   right leg   TUBAL LIGATION  1989    Family History  Problem Relation Age of Onset   Breast cancer Mother 110   Diabetes Mother    Cancer Mother        mouth cancer   COPD Mother    Kidney failure Father        kidney transplant   Breast cancer Sister 81   Hypertension Sister    Hypertension Brother        donated kidney to father   Colon cancer Maternal Grandmother 5   AAA (abdominal aortic aneurysm) Maternal Grandmother 28       cause of death   Lung cancer Maternal Grandfather 49   Ovarian cancer Paternal Grandmother 64    Social History   Socioeconomic History   Marital status: Married    Spouse name: Not  on file   Number of children: 1   Years of education: Not on file   Highest education level: Not on file  Occupational History   Occupation: Geologist, engineering  Tobacco Use   Smoking status: Never   Smokeless tobacco: Never  Vaping Use   Vaping status: Never Used  Substance and Sexual Activity   Alcohol use: No   Drug use: No   Sexual activity: Yes    Birth control/protection: Post-menopausal  Other Topics Concern   Not on file  Social History Narrative   Not on file   Social Drivers of Health   Financial Resource Strain: Low Risk  (10/13/2023)   Received from [facility]   Overall Financial Resource Strain (CARDIA)    Difficulty of Paying Living Expenses: Not very hard  Food Insecurity: No Food Insecurity (10/13/2023)   Received from [facility]   Hunger Vital Sign    Worried About Running Out of Food in the Last Year: Never true    Ran Out of  Food in the Last Year: Never true  Transportation Needs: No Transportation Needs (10/13/2023)   Received from [facility] - Transportation    In the past 12 months, has lack of transportation kept you from medical appointments or from getting medications?: No    Lack of Transportation (Non-Medical): No  Physical Activity: Insufficiently Active (08/28/2017)   Exercise Vital Sign    Days of Exercise per Week: 2 days    Minutes of Exercise per Session: 30 min  Stress: Stress Concern Present (08/28/2017)   [employer] of Occupational Health - Occupational Stress Questionnaire    Feeling of Stress : To some extent  Social Connections: Socially Integrated (08/28/2017)   Social Connection and Isolation Panel [NHANES]    Frequency of Communication with Friends and Family: More than three times a week    Frequency of Social Gatherings with Friends and Family: Once a week    Attends Religious Services: More than 4 times per year    Active Member of [employer] or Organizations: Yes    Attends Engineer, structural: More than 4 times per year    Marital Status: Married  Catering manager Violence: Not At Risk (08/28/2017)   Humiliation, Afraid, Rape, and Kick questionnaire    Fear of Current or Ex-Partner: No    Emotionally Abused: No    Physically Abused: No    Sexually Abused: No     Current Outpatient Medications:    calcium citrate-vitamin D (CITRACAL+D) 315-200 MG-UNIT tablet, Take by mouth., Disp: , Rfl:    Cetirizine HCl 10 MG CAPS, Take by mouth., Disp: , Rfl:    citalopram (CELEXA) 20 MG tablet, Take by mouth., Disp: , Rfl:      ROS:  Review of Systems  Constitutional:  Negative for fatigue, fever and unexpected weight change.  Respiratory:  Negative for cough, shortness of breath and wheezing.   Cardiovascular:  Positive for chest pain. Negative for palpitations and leg swelling.  Gastrointestinal:  Negative for blood in stool,  constipation, diarrhea, nausea and vomiting.  Endocrine: Negative for cold intolerance, heat intolerance and polyuria.  Genitourinary:  Negative for dyspareunia, dysuria, flank pain, frequency, genital sores, hematuria, menstrual problem, pelvic pain, urgency, vaginal bleeding, vaginal discharge and vaginal pain.  Musculoskeletal:  Positive for arthralgias. Negative for back pain, joint swelling and myalgias.  Skin:  Negative for rash.  Neurological:  Negative for dizziness, syncope, light-headedness, numbness  and headaches.  Hematological:  Negative for adenopathy.  Psychiatric/Behavioral:  Positive for agitation and dysphoric mood. Negative for confusion, sleep disturbance and suicidal ideas. The patient is not nervous/anxious.    BREAST: No symptoms    Objective: There were no vitals taken for this visit.   Physical Exam Constitutional:      Appearance: She is well-developed.  Genitourinary:     Vulva normal.     Right Labia: No rash, tenderness or lesions.    Left Labia: No tenderness, lesions or rash.    No vaginal discharge, erythema or tenderness.      Right Adnexa: not tender and no mass present.    Left Adnexa: not tender and no mass present.    No cervical friability or polyp.     Uterus is not enlarged or tender.  Breasts:    Right: No mass, nipple discharge, skin change or tenderness.     Left: No mass, nipple discharge, skin change or tenderness.  Neck:     Thyroid: No thyromegaly.  Cardiovascular:     Rate and Rhythm: Normal rate and regular rhythm.     Heart sounds: Normal heart sounds. No murmur heard. Pulmonary:     Effort: Pulmonary effort is normal.     Breath sounds: Normal breath sounds.  Abdominal:     Palpations: Abdomen is soft.     Tenderness: There is no abdominal tenderness. There is no guarding or rebound.  Musculoskeletal:        General: Normal range of motion.     Cervical back: Normal range of motion.  Lymphadenopathy:     Cervical: No  cervical adenopathy.  Neurological:     General: No focal deficit present.     Mental Status: She is alert and oriented to person, place, and time.     Cranial Nerves: No cranial nerve deficit.  Skin:    General: Skin is warm and dry.  Psychiatric:        Mood and Affect: Mood normal.        Behavior: Behavior normal.        Thought Content: Thought content normal.        Judgment: Judgment normal.  Vitals reviewed.     Assessment/Plan: Encounter for annual routine gynecological examination  Encounter for screening mammogram for malignant neoplasm of breast - Plan: MM 3D SCREENING MAMMOGRAM BILATERAL BREAST; pt to schedule mammo  Other chest pain - Plan: Ambulatory referral to Cardiology; refer to cardio to rule out path; if neg, most likely stress related.  Blood tests for routine general physical examination - Plan: Comprehensive metabolic panel, Lipid panel, Hemoglobin A1c  Screening cholesterol level - Plan: Lipid panel  Elevated lipids - Plan: Lipid panel  Screening for diabetes mellitus  BMI 30.0-30.9,adult - Plan: Hemoglobin A1c   GYN counsel breast self exam, mammography screening, menopause, adequate intake of calcium and vitamin D, diet and exercise    F/U  No follow-ups on file.  Aniken Monestime B. Sheccid Lahmann, PA-C 12/02/2023 8:54 PM

## 2023-12-03 ENCOUNTER — Other Ambulatory Visit (HOSPITAL_COMMUNITY)
Admission: RE | Admit: 2023-12-03 | Discharge: 2023-12-03 | Disposition: A | Discharge status: Home / Self Care | Source: Ambulatory Visit | Attending: Obstetrics and Gynecology | Admitting: Obstetrics and Gynecology

## 2023-12-03 ENCOUNTER — Ambulatory Visit (INDEPENDENT_AMBULATORY_CARE_PROVIDER_SITE_OTHER): Payer: Self-pay | Admitting: Obstetrics and Gynecology

## 2023-12-03 ENCOUNTER — Other Ambulatory Visit: Payer: Self-pay

## 2023-12-03 ENCOUNTER — Encounter: Payer: Self-pay | Admitting: Obstetrics and Gynecology

## 2023-12-03 VITALS — BP 100/66 | Ht 62.0 in | Wt 171.0 lb

## 2023-12-03 DIAGNOSIS — Z1151 Encounter for screening for human papillomavirus (HPV): Secondary | ICD-10-CM | POA: Diagnosis present

## 2023-12-03 DIAGNOSIS — Z01419 Encounter for gynecological examination (general) (routine) without abnormal findings: Secondary | ICD-10-CM | POA: Diagnosis not present

## 2023-12-03 DIAGNOSIS — Z124 Encounter for screening for malignant neoplasm of cervix: Secondary | ICD-10-CM

## 2023-12-03 DIAGNOSIS — Z803 Family history of malignant neoplasm of breast: Secondary | ICD-10-CM

## 2023-12-03 DIAGNOSIS — Z0000 Encounter for general adult medical examination without abnormal findings: Secondary | ICD-10-CM

## 2023-12-03 DIAGNOSIS — Z1231 Encounter for screening mammogram for malignant neoplasm of breast: Secondary | ICD-10-CM

## 2023-12-03 DIAGNOSIS — E785 Hyperlipidemia, unspecified: Secondary | ICD-10-CM

## 2023-12-03 DIAGNOSIS — Z8041 Family history of malignant neoplasm of ovary: Secondary | ICD-10-CM

## 2023-12-03 NOTE — Patient Instructions (Signed)
 I value your feedback and you entrusting [place] with your care. If you get a [facility] patient survey, I would appreciate you taking the time to let us know about your experience today. Thank you!  [facility] ([place])--[phone]

## 2023-12-04 LAB — LIPID PANEL
Chol/HDL Ratio: 3.6 ratio (ref 0.0–4.4)
Cholesterol, Total: 246 mg/dL — ABNORMAL HIGH (ref 100–199)
HDL: 68 mg/dL (ref >39)
LDL Chol Calc (NIH): 160 mg/dL — ABNORMAL HIGH (ref 0–99)
Triglycerides: 102 mg/dL (ref 0–149)
VLDL Cholesterol Cal: 18 mg/dL (ref 5–40)

## 2023-12-04 LAB — COMPREHENSIVE METABOLIC PANEL WITH GFR
ALT: 24 IU/L (ref 0–32)
AST: 23 IU/L (ref 0–40)
Albumin: 4.5 g/dL (ref 3.9–4.9)
Alkaline Phosphatase: 56 IU/L (ref 44–121)
BUN/Creatinine Ratio: 22 (ref 12–28)
BUN: 19 mg/dL (ref 8–27)
Bilirubin Total: 0.6 mg/dL (ref 0.0–1.2)
CO2: 23 mmol/L (ref 20–29)
Calcium: 9.5 mg/dL (ref 8.7–10.3)
Chloride: 100 mmol/L (ref 96–106)
Creatinine, Ser: 0.87 mg/dL (ref 0.57–1.00)
Globulin, Total: 2.4 g/dL (ref 1.5–4.5)
Glucose: 80 mg/dL (ref 70–99)
Potassium: 4.2 mmol/L (ref 3.5–5.2)
Sodium: 139 mmol/L (ref 134–144)
Total Protein: 6.9 g/dL (ref 6.0–8.5)
eGFR: 75 mL/min/{1.73_m2} (ref >59)

## 2023-12-04 LAB — CBC WITH DIFFERENTIAL/PLATELET
Basophils Absolute: 0.1 10*3/uL (ref 0.0–0.2)
Basos: 1 %
EOS (ABSOLUTE): 0.2 10*3/uL (ref 0.0–0.4)
Eos: 3 %
Hematocrit: 40.7 % (ref 34.0–46.6)
Hemoglobin: 13 g/dL (ref 11.1–15.9)
Immature Grans (Abs): 0 10*3/uL (ref 0.0–0.1)
Immature Granulocytes: 0 %
Lymphocytes Absolute: 2 10*3/uL (ref 0.7–3.1)
Lymphs: 29 %
MCH: 30.3 pg (ref 26.6–33.0)
MCHC: 31.9 g/dL (ref 31.5–35.7)
MCV: 95 fL (ref 79–97)
Monocytes Absolute: 0.5 10*3/uL (ref 0.1–0.9)
Monocytes: 8 %
Neutrophils Absolute: 4 10*3/uL (ref 1.4–7.0)
Neutrophils: 59 %
Platelets: 259 10*3/uL (ref 150–450)
RBC: 4.29 x10E6/uL (ref 3.77–5.28)
RDW: 12.8 % (ref 11.7–15.4)
WBC: 6.9 10*3/uL (ref 3.4–10.8)

## 2023-12-06 ENCOUNTER — Encounter: Payer: Self-pay | Admitting: Obstetrics and Gynecology

## 2023-12-07 LAB — CYTOLOGY - PAP
Comment: NEGATIVE
Diagnosis: NEGATIVE
High risk HPV: NEGATIVE

## 2023-12-10 ENCOUNTER — Ambulatory Visit
Admission: RE | Admit: 2023-12-10 | Discharge: 2023-12-10 | Disposition: A | Discharge status: Home / Self Care | Source: Ambulatory Visit | Attending: Obstetrics and Gynecology | Admitting: Obstetrics and Gynecology

## 2023-12-10 DIAGNOSIS — Z1231 Encounter for screening mammogram for malignant neoplasm of breast: Secondary | ICD-10-CM | POA: Diagnosis present

## 2023-12-14 ENCOUNTER — Encounter: Payer: Self-pay | Admitting: Obstetrics and Gynecology
